# Patient Record
Sex: Female | Born: 1995 | Race: White | Hispanic: No | Marital: Single | Smoking: Never smoker
Health system: Southern US, Community
[De-identification: ages and names within clinical notes are randomized; demographics above are authoritative.]

## PROBLEM LIST (undated history)

## (undated) DIAGNOSIS — E079 Disorder of thyroid, unspecified: Secondary | ICD-10-CM

## (undated) DIAGNOSIS — T7840XA Allergy, unspecified, initial encounter: Secondary | ICD-10-CM

## (undated) DIAGNOSIS — L309 Dermatitis, unspecified: Secondary | ICD-10-CM

## (undated) DIAGNOSIS — R7303 Prediabetes: Secondary | ICD-10-CM

## (undated) DIAGNOSIS — E669 Obesity, unspecified: Secondary | ICD-10-CM

## (undated) DIAGNOSIS — E783 Hyperchylomicronemia: Secondary | ICD-10-CM

## (undated) HISTORY — DX: Dermatitis, unspecified: L30.9

## (undated) HISTORY — DX: Obesity, unspecified: E66.9

## (undated) HISTORY — DX: Allergy, unspecified, initial encounter: T78.40XA

## (undated) HISTORY — DX: Prediabetes: R73.03

## (undated) HISTORY — DX: Hyperchylomicronemia: E78.3

## (undated) HISTORY — DX: Disorder of thyroid, unspecified: E07.9

---
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
## 2017-04-08 ENCOUNTER — Ambulatory Visit: Payer: Commercial Managed Care - PPO | Admitting: Family Medicine

## 2017-04-08 ENCOUNTER — Encounter: Payer: Self-pay | Admitting: Family Medicine

## 2017-04-08 VITALS — BP 118/68 | HR 95 | Temp 98.5°F | Resp 16 | Wt 193.9 lb

## 2017-04-08 DIAGNOSIS — J029 Acute pharyngitis, unspecified: Secondary | ICD-10-CM | POA: Diagnosis not present

## 2017-04-08 LAB — POCT RAPID STREP A (OFFICE): Rapid Strep A Screen: NEGATIVE

## 2017-04-08 MED ORDER — AMOXICILLIN-POT CLAVULANATE 875-125 MG PO TABS: 1.0000 | ORAL_TABLET | Freq: Two times a day (BID) | ORAL | 0 refills | Status: DC

## 2017-04-08 NOTE — Progress Notes (Signed)
Name: Alexandra Leo GrosserM Regis   MRN: [id number]    DOB: 05/06/1996   Date:04/08/2017       Progress Note  Subjective  Chief Complaint  Chief Complaint  Patient presents with  . Sore Throat    since sunday; pt denies any other symptoms. sore,. painful this morning and scratchy     Sore Throat   This is a new problem. The current episode started yesterday. There has been no fever. Associated symptoms include coughing and trouble swallowing. Pertinent negatives include no ear pain or neck pain. She has tried acetaminophen for the symptoms. The treatment provided moderate relief.     Past Medical History:  Diagnosis Date  . Allergy   . Eczema   . 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 Obesity (BMI 35.0-39.9 without comorbidity) 11/22/2014   Has gained 10lbs in a few months by 03/24/14   . Prediabetes 07/09/2016  . Thyroid disease     History reviewed. No pertinent surgical history.  Family History  Problem Relation Age of Onset  . Hypertension Mother        borderline  . Hypothyroidism Mother   . Hypertension Father        borderline  . Hypothyroidism Sister   . Hypertension Maternal Grandmother   . Alzheimer's disease Maternal Grandmother   . Alzheimer's disease Maternal Grandfather     Social History   Socioeconomic History  . Marital status: Single    Spouse name: Not on file  . Number of children: Not on file  . Years of education: Not on file  . Highest education level: Not on file  Social Needs  . Financial resource strain: Not on file  . Food insecurity - worry: Not on file  . Food insecurity - inability: Not on file  . Transportation needs - medical: Not on file  . Transportation needs - non-medical: Not on file  Occupational History  . Not on file  Tobacco Use  . Smoking status: Never Smoker  . Smokeless tobacco: Never Used  Substance and Sexual Activity  . Alcohol use: No    Alcohol/week: 0.0 oz  . Drug use: No  . Sexual activity: No  Other Topics  Concern  . Not on file  Social History Narrative  . Not on file     Current Outpatient Medications:  .  ibuprofen (ADVIL,MOTRIN) 200 MG tablet, Take 200 mg by mouth every 6 (six) hours as needed. Taking 200 mg -400 mg tablet as needed depending on pain, Disp: , Rfl:  .  levothyroxine (SYNTHROID, LEVOTHROID) 100 MCG tablet, Take 1 tablet (100 mcg total) by mouth daily. *Brand name Synthroid medically necessary*, Disp: 30 tablet, Rfl: 11 .  triamcinolone ointment (KENALOG) 0.1 %, Apply 1 application topically 2 (two) times daily. Too strong for face, groin, underarms (Patient not taking: Reported on 10/25/2016), Disp: 30 g, Rfl: 0  No Known Allergies   Review of Systems  HENT: Positive for trouble swallowing. Negative for ear pain.   Respiratory: Positive for cough.   Musculoskeletal: Negative for neck pain.     Objective  Vitals:   04/08/17 1049  BP: 118/68  Pulse: 95  Resp: 16  Temp: 98.5 F (36.9 C)  TempSrc: Oral  SpO2: 98%  Weight: 193 lb 14.4 oz (88 kg)    Physical Exam  Constitutional: She is well-developed, well-nourished, and in no distress.  HENT:  Head: Normocephalic and atraumatic.  Right Ear: Tympanic membrane and ear canal normal. No drainage or swelling.  Left Ear: Tympanic membrane and ear canal normal. No drainage or swelling.  Nose: Right sinus exhibits no maxillary sinus tenderness and no frontal sinus tenderness. Left sinus exhibits no maxillary sinus tenderness and no frontal sinus tenderness.  Mouth/Throat: Posterior oropharyngeal erythema present. No oropharyngeal exudate or posterior oropharyngeal edema.  Cardiovascular: Normal rate, regular rhythm and normal heart sounds.  No murmur heard. Pulmonary/Chest: Effort normal and breath sounds normal. She has no wheezes.  Abdominal: Soft. Bowel sounds are normal. There is no tenderness.  Lymphadenopathy:       Right cervical: No superficial cervical adenopathy present.      Left cervical: No  superficial cervical adenopathy present.  Psychiatric: Mood, memory, affect and judgment normal.  Nursing note and vitals reviewed.     Recent Results (from the past 2160 hour(s))  POCT rapid strep A     Status: Normal   Collection Time: 04/08/17 10:59 AM  Result Value Ref Range   Rapid Strep A Screen Negative Negative     Assessment & Plan  1. Sore throat Rapid strep is negative, suspect resolving viral URI.  In case symptoms continue unabated after 3-4 days of conservative treatment, she may consider starting on antibiotic which was provided at today's visit. - POCT rapid strep A - amoxicillin-clavulanate (AUGMENTIN) 875-125 MG tablet; Take 1 tablet by mouth 2 (two) times daily.  Dispense: 14 tablet; Refill: 0  Japheth Diekman Asad A. Faylene KurtzShah [facility] 04/08/2017 11:06 AM

## 2017-04-26 ENCOUNTER — Ambulatory Visit: Payer: Commercial Managed Care - PPO | Admitting: Family Medicine

## 2017-10-01 IMAGING — CR DG ABDOMEN 1V
2 series · 2 of 2 positions shown · non-contrast
Comparison: None.

CLINICAL DATA: Lower abdominal pain since this morning. Patient has
mononucleosis.

EXAM:
ABDOMEN - 1 VIEW

[abdomen kub (1 of 2)]
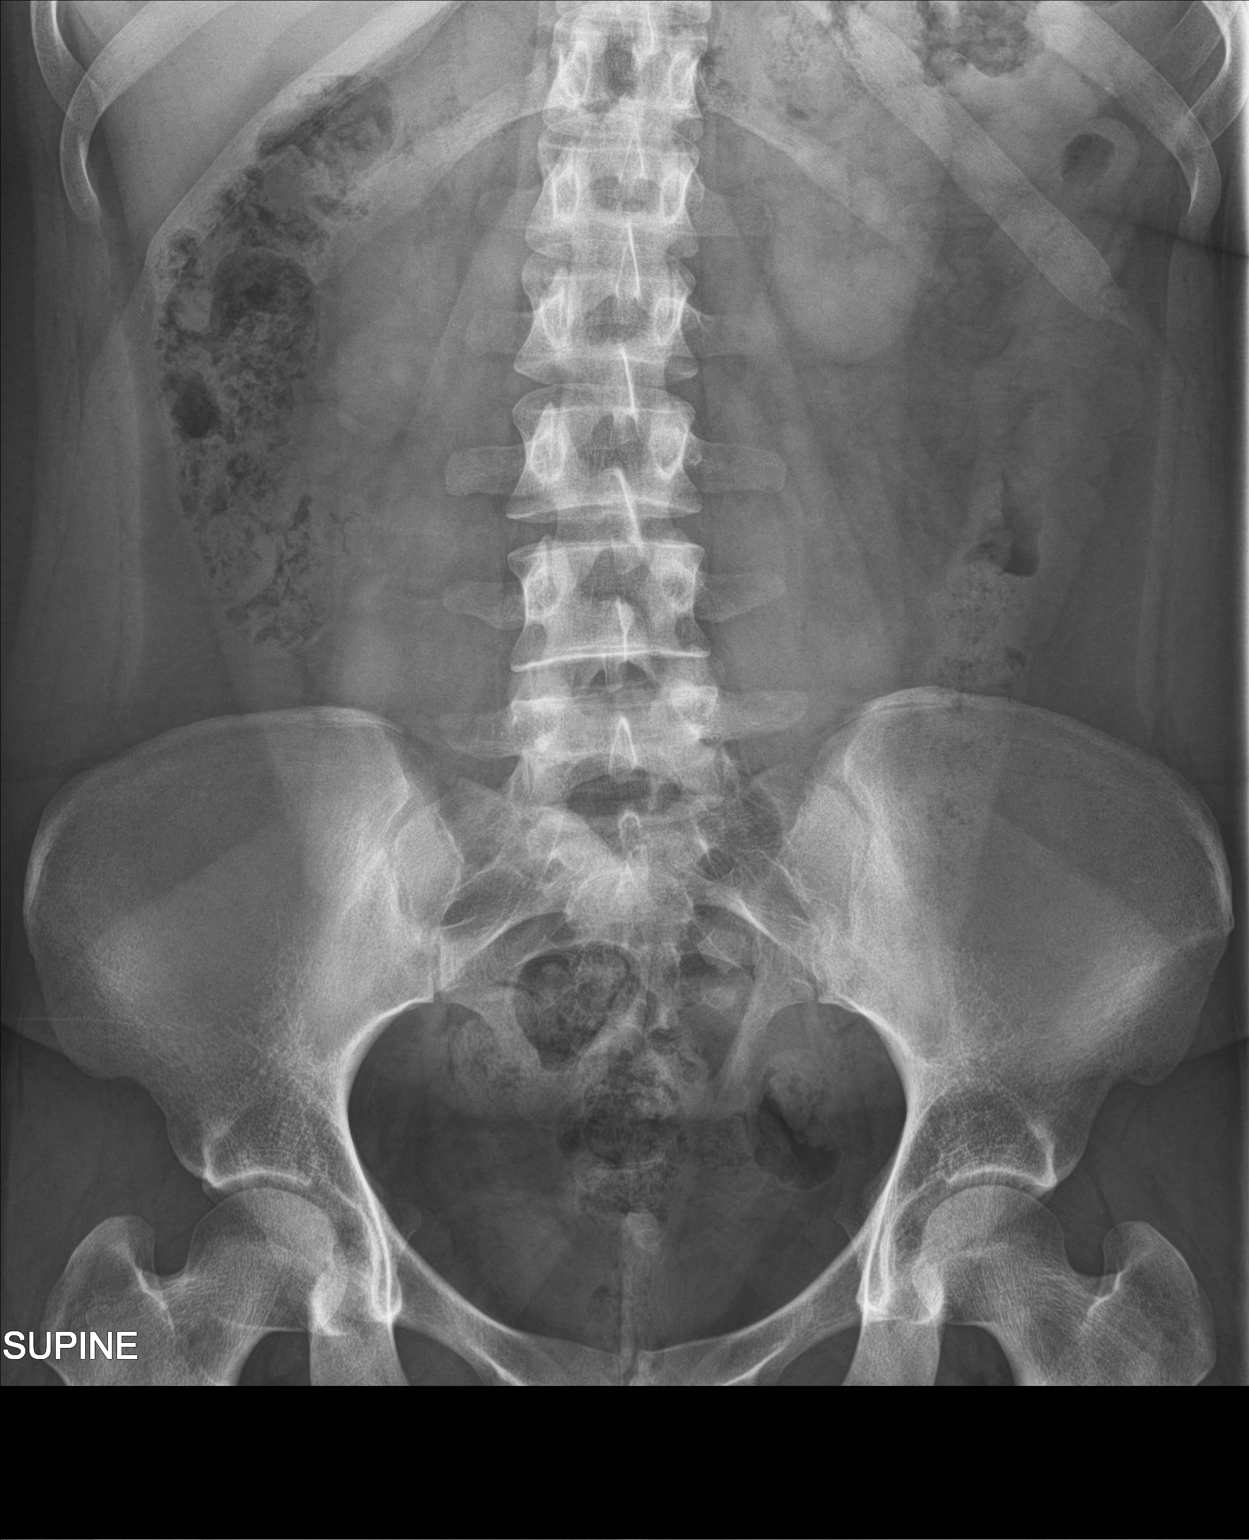

[abdomen kub (2 of 2)]
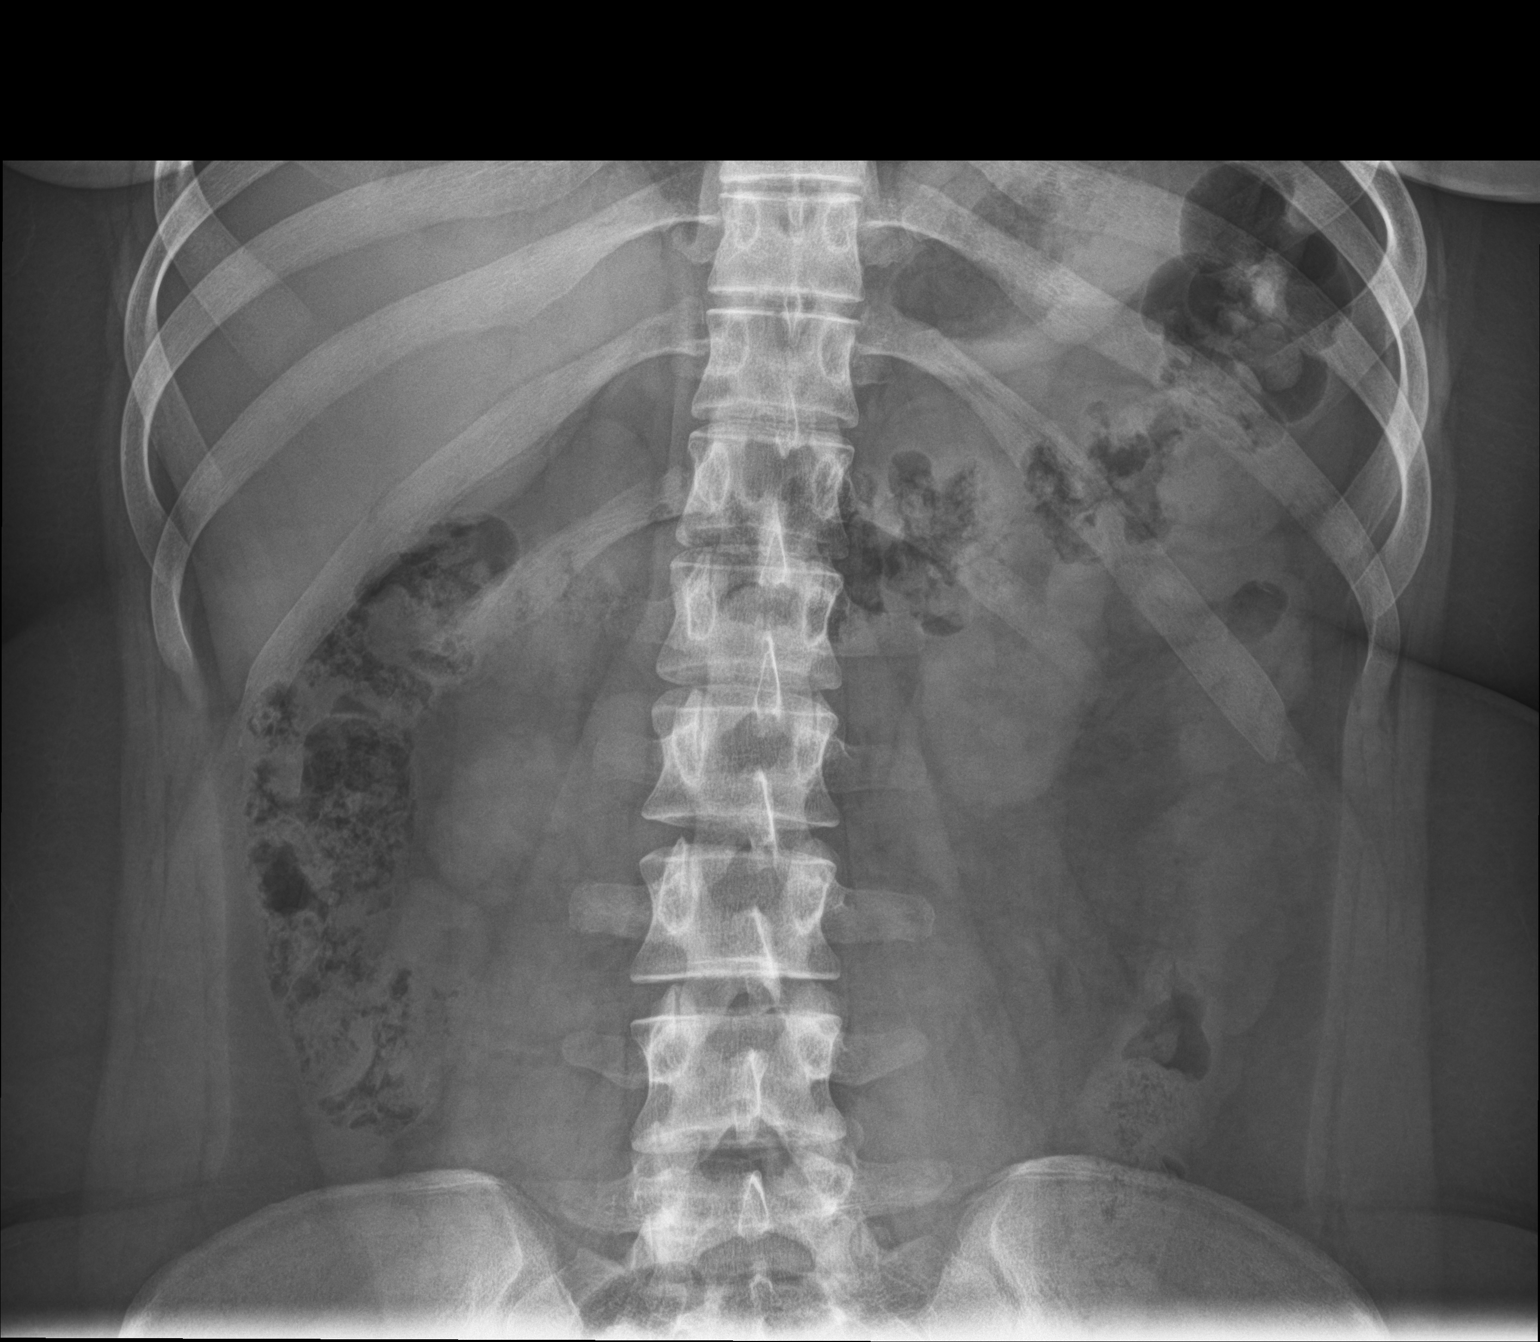

[2 of 2 positions shown; findings below may reference images not displayed]

FINDINGS: The visualized lung bases are clear. The abdominal bowel gas pattern
is unremarkable. The soft tissue shadows are maintained. No
worrisome calcifications. The bony structures are normal.
IMPRESSION: No plain film findings for an acute abdominal process.

## 2017-11-18 ENCOUNTER — Telehealth: Payer: Self-pay | Admitting: Family Medicine

## 2017-11-18 NOTE — Telephone Encounter (Signed)
Lab Results  Component Value Date   TSH 3.05 11/01/2016   Last appt and labs were over a year ago I'll refill her med, and please ask her to schedule an appointment in the next month Thank you

## 2017-11-18 NOTE — Telephone Encounter (Signed)
Called [phone] and left voice message then I called [phone] and spoke with mom told her to have the patient to return call to schedule appt.

## 2017-12-19 ENCOUNTER — Other Ambulatory Visit: Payer: Self-pay | Admitting: Family Medicine

## 2017-12-20 NOTE — Telephone Encounter (Signed)
Lab Results  Component Value Date   TSH 3.05 11/01/2016   Patient needs an appointment Last thyroid check was over a year ago I have not seen her since June 2018 Please see the OPEN phone note from 11/18/17; please complete and sign off on that note

## 2017-12-21 ENCOUNTER — Other Ambulatory Visit: Payer: Self-pay | Admitting: Family Medicine

## 2017-12-23 ENCOUNTER — Telehealth: Payer: Self-pay | Admitting: Nurse Practitioner

## 2017-12-23 NOTE — Telephone Encounter (Signed)
This is open and I am closing it out; see other phone note

## 2017-12-23 NOTE — Telephone Encounter (Signed)
Left voice message on [phone] and [phone] informing pt to give office a call to schedule appt for additional med refill.

## 2017-12-23 NOTE — Telephone Encounter (Signed)
Tried calling [phone] and  [phone] left voice mail informing pt to give the office a call to schedule an appt

## 2017-12-23 NOTE — Telephone Encounter (Signed)
Please call and schedule an appointment within the next week or so; received request for synthroid patient has not been seen in clinic for thyroid in over one year. Will fill 14 days of medicine to allow for time for appointment.

## 2017-12-23 NOTE — Telephone Encounter (Signed)
Please see my note and ask patient to schedule an appointment

## 2017-12-31 ENCOUNTER — Other Ambulatory Visit: Payer: Self-pay

## 2017-12-31 NOTE — Telephone Encounter (Signed)
Refill request for thyroid medication:  Synthroid 100 mcg  Last Physical: None indicated  Lab Results  Component Value Date   TSH 3.05 11/01/2016   No follow up.

## 2018-01-02 NOTE — Telephone Encounter (Signed)
Patient needs appointment in order to obtain further refills.  

## 2018-01-02 NOTE — Telephone Encounter (Signed)
Spoke with mom and she stated that she will call back once she check her schedule.

## 2018-01-10 ENCOUNTER — Other Ambulatory Visit: Payer: Self-pay

## 2018-01-10 DIAGNOSIS — E038 Other specified hypothyroidism: Secondary | ICD-10-CM

## 2018-01-10 MED ORDER — SYNTHROID 100 MCG PO TABS: 100.0000 ug | ORAL_TABLET | Freq: Every day | ORAL | 0 refills | Status: DC

## 2018-01-10 NOTE — Telephone Encounter (Signed)
Refill request for thyroid medication: Synthroid 100 mcg  Last Physical: None indicated  Lab Results  Component Value Date   TSH 3.05 11/01/2016    Follow-ups on file. 01/21/2018

## 2018-01-17 ENCOUNTER — Ambulatory Visit: Payer: Commercial Managed Care - PPO | Admitting: Obstetrics & Gynecology

## 2018-01-17 ENCOUNTER — Ambulatory Visit (INDEPENDENT_AMBULATORY_CARE_PROVIDER_SITE_OTHER): Payer: Commercial Managed Care - PPO

## 2018-01-17 ENCOUNTER — Telehealth: Payer: Self-pay

## 2018-01-17 ENCOUNTER — Other Ambulatory Visit: Payer: Self-pay | Admitting: Obstetrics & Gynecology

## 2018-01-17 ENCOUNTER — Encounter: Payer: Self-pay | Admitting: Obstetrics & Gynecology

## 2018-01-17 VITALS — BP 120/80 | Ht 61.0 in | Wt 185.0 lb

## 2018-01-17 DIAGNOSIS — M545 Low back pain, unspecified: Secondary | ICD-10-CM

## 2018-01-17 DIAGNOSIS — R1031 Right lower quadrant pain: Secondary | ICD-10-CM | POA: Diagnosis not present

## 2018-01-17 DIAGNOSIS — N83202 Unspecified ovarian cyst, left side: Secondary | ICD-10-CM

## 2018-01-17 DIAGNOSIS — R1032 Left lower quadrant pain: Secondary | ICD-10-CM

## 2018-01-17 MED ORDER — MELOXICAM 7.5 MG PO TABS: 7.5000 mg | ORAL_TABLET | Freq: Two times a day (BID) | ORAL | 0 refills | Status: DC | PRN

## 2018-01-17 NOTE — Telephone Encounter (Signed)
Pt's dad calling for pain med to be called in for pt's pain.  She was seen  This am by [facility].  [phone]

## 2018-01-17 NOTE — Telephone Encounter (Signed)
WIll eRx Meloxicam, but need to be careful as pain meds can often upset GI, and her pain may be GI related more so than cyst related.

## 2018-01-17 NOTE — Progress Notes (Addendum)
Gynecology Pelvic Pain Evaluation   Chief Complaint:  Chief Complaint  Patient presents with  . Back Pain  . Abdominal Pain    History of Present Illness:   Patient is a 22 y.o. No obstetric history on file. who LMP was Patient's last menstrual period was 12/27/2017., presents today for a problem visit.  She complains of pain.   Her pain is localized to the suprapubic and low back (separate but same time span) area, described as constant, burning and stabbing, began a week ago and its severity is described as severe. The pain radiates to the  Non-radiating. She has these associated symptoms which include abdominal pain, diarrhea, fatigue, nausea and vomiting. Patient has these modifiers which include relaxation that make it better and unable to associate with any factor that make it worse.  Context includes: spontaneous.  Past history includes reg cycles w Dysmenorrhes (not on period now);  Usually reg and 7 days flow.  Denies sexual activity.  No vag discharge or bleeding.  Previous evaluation: emergency room visit on 01/14/18.  Prior Diagnosis: none.  Labs normal in ER.  No imaging studies done. Previous Treatment: none.  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 Also,  has no past surgical history on file., family history includes Alzheimer's disease in her maternal grandfather and maternal grandmother; Hypertension in her father, maternal grandmother, and mother; Hypothyroidism in her mother and sister.,  reports that she has never smoked. She has never used smokeless tobacco. She reports that she does not drink alcohol or use drugs.  She has a current medication list which includes the following prescription(s): ibuprofen, synthroid, and triamcinolone ointment. Also, has No Known Allergies.  Review of Systems  Constitutional:  Positive for malaise/fatigue. Negative for chills and fever.  HENT: Negative for congestion, sinus pain and sore throat.   Eyes: Negative for blurred vision and pain.  Respiratory: Positive for shortness of breath. Negative for cough and wheezing.   Cardiovascular: Negative for chest pain and leg swelling.  Gastrointestinal: Positive for abdominal pain, diarrhea, nausea and vomiting. Negative for constipation and heartburn.  Genitourinary: Positive for frequency. Negative for dysuria, hematuria and urgency.  Musculoskeletal: Negative for back pain, joint pain, myalgias and neck pain.  Skin: Negative for itching and rash.  Neurological: Positive for dizziness and headaches. Negative for tremors and weakness.  Endo/Heme/Allergies: Does not bruise/bleed easily.  Psychiatric/Behavioral: Negative for depression. The patient is not nervous/anxious and does not have insomnia.    Objective: BP 120/80   Ht 5\' 1"  (1.549 m)   Wt 185 lb (83.9 kg)   LMP 12/27/2017   BMI 34.96 kg/m  Physical Exam  Constitutional: She is oriented to person, place, and time. She appears well-developed and well-nourished. No distress.  Abdominal: Soft. Normal appearance and bowel sounds are normal. There is no hepatosplenomegaly. There is generalized tenderness. There is no rebound, no guarding, no tenderness at McBurney's point and negative Murphy's sign.  Musculoskeletal: Normal range of motion.  Neurological: She is alert and oriented to person, place, and time.  Skin: Skin is warm and dry.  Psychiatric: She has a normal mood and affect.  Vitals reviewed.  Assessment: 22 y.o. Go with : 1. Acute bilateral lower abdominal pain 2. Low back pain Likely Gastroenteritis Consideration for ovarian cysts discussed - US PELVIS (TRANSABDOMINAL First due to lack of sexual history) - Rest Tylenol, Heat to back,  fluids  Annamarie Major, MD, [facility], [facility] 01/17/2018  10:45  AM  ADDENDUM: Review of ULTRASOUND.    I have personally reviewed images and report of recent ultrasound done at [facility].    Plan of management to be discussed with patient.    Small cyst, otherwise normal Monitor for persistance or recurrence  Annamarie Major, MD, [facility], [facility] 01/17/2018  2:07 PM

## 2018-01-17 NOTE — Telephone Encounter (Signed)
Left message to make pt aware.  

## 2018-01-17 NOTE — Patient Instructions (Signed)

## 2018-01-21 ENCOUNTER — Ambulatory Visit: Payer: Commercial Managed Care - PPO | Admitting: Family Medicine

## 2018-02-24 ENCOUNTER — Telehealth: Payer: Self-pay | Admitting: Family Medicine

## 2018-02-24 DIAGNOSIS — E038 Other specified hypothyroidism: Secondary | ICD-10-CM

## 2018-02-24 MED ORDER — SYNTHROID 100 MCG PO TABS: 100.0000 ug | ORAL_TABLET | Freq: Every day | ORAL | 0 refills | Status: DC

## 2018-02-24 NOTE — Telephone Encounter (Signed)
Medication: SYNTHROID 100 MCG tablet - mother thinks she has a few days left  - appt scheduled for 03/11/18 9:00am with Dr. Sherie Don  Last OV Dr. Sherie Don 10/25/16 Last refill Synthroid 01/10/18 12 tablets no refills  Has the patient contacted their pharmacy? Yes - no refills and advised to schedule appt Preferred Pharmacy (with phone number or street name): [id number] - [place], [place] - [address]            [phone] (Phone) [phone] (Fax)

## 2018-02-24 NOTE — Telephone Encounter (Signed)
Copied from CRM [id number]. Topic: Quick Communication - Rx Refill/Question >> Feb 24, 2018 10:45 AM Alexandra Crane wrote: Medication: SYNTHROID 100 MCG tablet - mother thinks she has a few days left  - appt scheduled for 03/11/18 9:00am with Dr. Sherie Don  Last OV Dr. Sherie Don 10/25/16 Last refill Synthroid 01/10/18 12 tablets no refills  Has the patient contacted their pharmacy? Yes - no refills and advised to schedule appt Preferred Pharmacy (with phone number or street name): [id number] - [place], [place] - [address] [phone] (Phone) [phone] (Fax)

## 2018-03-11 ENCOUNTER — Ambulatory Visit: Payer: Commercial Managed Care - PPO | Admitting: Family Medicine

## 2018-03-13 ENCOUNTER — Ambulatory Visit: Payer: Commercial Managed Care - PPO | Admitting: Family Medicine

## 2018-03-14 ENCOUNTER — Encounter: Payer: Self-pay | Admitting: Family Medicine

## 2018-03-14 ENCOUNTER — Ambulatory Visit: Payer: Commercial Managed Care - PPO | Admitting: Family Medicine

## 2018-03-14 VITALS — BP 126/78 | HR 105 | Temp 98.3°F | Resp 16 | Ht 61.0 in | Wt 193.9 lb

## 2018-03-14 DIAGNOSIS — L2082 Flexural eczema: Secondary | ICD-10-CM

## 2018-03-14 DIAGNOSIS — Z23 Encounter for immunization: Secondary | ICD-10-CM | POA: Diagnosis not present

## 2018-03-14 DIAGNOSIS — E669 Obesity, unspecified: Secondary | ICD-10-CM

## 2018-03-14 DIAGNOSIS — E783 Hyperchylomicronemia: Secondary | ICD-10-CM

## 2018-03-14 DIAGNOSIS — R7303 Prediabetes: Secondary | ICD-10-CM

## 2018-03-14 DIAGNOSIS — E038 Other specified hypothyroidism: Secondary | ICD-10-CM

## 2018-03-14 DIAGNOSIS — R718 Other abnormality of red blood cells: Secondary | ICD-10-CM

## 2018-03-14 MED ORDER — TRIAMCINOLONE ACETONIDE 0.1 % EX CREA: 1.0000 "application " | TOPICAL_CREAM | Freq: Two times a day (BID) | CUTANEOUS | 0 refills | Status: DC

## 2018-03-14 NOTE — Progress Notes (Signed)
Name: Alexandra Crane   MRN: [id number]    DOB: [birth year]   Date:03/14/2018       Progress Note  Subjective  Chief Complaint  Chief Complaint  Patient presents with  . Hypothyroidism    HPI  Pt presents to follow up:  Hypothyroidism: She was diagnosed at age 22yo; misses about 3-4 pills a month. She denies having any hair/skin/nail changes, constipation/diarrhea, palpitations, has gained about 8lbs.  Obesity & Prediabetes: Does not exercise - would like to start walking.  Mom cooks at home for the most part; eats some salads at home, tends to eat burgers/fries/chicken/fast food as well. Significant education is provided. Mom notes that pt's dad brings a lot of junk food in to the house. Referral to nutritionist. Extensive discussion on weight management & prediabetes is had.  Eczema: No current outbreak; uses triamcinolone; usually only in her AC and popliteal fossas.   Hx elevated HCT - we will recheck today.  Eelvated TGD's - in 2018; we will recheck today.  Patient Active Problem List   Diagnosis Date Noted  . Allergic rhinitis 10/25/2016  . Snoring 08/08/2016  . Elevated hematocrit 08/08/2016  . 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 Prediabetes 07/09/2016  . Family planning 11/22/2014  . Flexural eczema 11/22/2014  . 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 Obesity (BMI 35.0-39.9 without comorbidity) 11/22/2014    History reviewed. No pertinent surgical history.  Family History  Problem Relation Age of Onset  . Hypertension Mother        borderline  . Hypothyroidism Mother   . Hypertension Father        borderline  . Hypothyroidism Sister   . Hypertension Maternal Grandmother   . Alzheimer's disease Maternal Grandmother   . Alzheimer's disease Maternal Grandfather     Social History   Socioeconomic History  . Marital status: Single    Spouse name: Not on file  . Number of children: Not on file  . Years of education: Not on file  . Highest education  level: Not on file  Occupational History  . Not on file  Social Needs  . Financial resource strain: Not on file  . Food insecurity:    Worry: Not on file    Inability: Not on file  . Transportation needs:    Medical: Not on file    Non-medical: Not on file  Tobacco Use  . Smoking status: Never Smoker  . Smokeless tobacco: Never Used  Substance and Sexual Activity  . Alcohol use: No    Alcohol/week: 0.0 standard drinks  . Drug use: No  . Sexual activity: Never  Lifestyle  . Physical activity:    Days per week: Not on file    Minutes per session: Not on file  . Stress: Not on file  Relationships  . Social connections:    Talks on phone: Not on file    Gets together: Not on file    Attends religious service: Not on file    Active member of club or organization: Not on file    Attends meetings of clubs or organizations: Not on file    Relationship status: Not on file  . Intimate partner violence:    Fear of current or ex partner: Not on file    Emotionally abused: Not on file    Physically abused: Not on file    Forced sexual activity: Not on file  Other Topics Concern  . Not on file  Social History Narrative  . Not  on file     Current Outpatient Medications:  .  ibuprofen (ADVIL,MOTRIN) 200 MG tablet, Take 200 mg by mouth every 6 (six) hours as needed. Taking 200 mg -400 mg tablet as needed depending on pain, Disp: , Rfl:  .  SYNTHROID 100 MCG tablet, Take 1 tablet (100 mcg total) by mouth daily. Routine refill at appointment, Disp: 30 tablet, Rfl: 0 .  triamcinolone cream (KENALOG) 0.1 %, Apply 1 application topically 2 (two) times daily., Disp: 30 g, Rfl: 0  No Known Allergies  I personally reviewed active problem list, medication list, allergies, lab results with the patient/caregiver today.   ROS Constitutional: Negative for fever or weight change.  Respiratory: Negative for cough and shortness of breath.   Cardiovascular: Negative for chest pain or  palpitations.  Gastrointestinal: Negative for abdominal pain, no bowel changes.  Musculoskeletal: Negative for gait problem or joint swelling.  Skin: Negative for rash.  Neurological: Negative for dizziness or headache.  No other specific complaints in a complete review of systems (except as listed in HPI above).  Objective  Vitals:   03/14/18 0850  BP: 126/78  Pulse: (!) 105  Resp: 16  Temp: 98.3 F (36.8 C)  TempSrc: Oral  SpO2: 98%  Weight: 193 lb 14.4 oz (88 kg)  Height: 5\' 1"  (1.549 m)   Body mass index is 36.64 kg/m.   Physical Exam  Constitutional: Patient appears well-developed and well-nourished, obese. No distress.  HENT: Head: Normocephalic and atraumatic. Ears: bilateral TMs with no erythema or effusion; Nose: Nose normal. Mouth/Throat: Oropharynx is clear and moist. No oropharyngeal exudate or tonsillar swelling.  Eyes: Conjunctivae and EOM are normal. No scleral icterus.  Pupils are equal, round, and reactive to light.  Neck: Normal range of motion. Neck supple. No JVD present. No thyromegaly present.  Cardiovascular: Normal rate, regular rhythm and normal heart sounds.  No murmur heard. No BLE edema. Pulmonary/Chest: Effort normal and breath sounds normal. No respiratory distress. Musculoskeletal: Normal range of motion, no joint effusions. No gross deformities Neurological: Pt is alert and oriented to person, place, and time. No cranial nerve deficit. Coordination, balance, strength, speech and gait are normal.  Skin: Skin is warm and dry. No rash noted. No erythema.  Psychiatric: Patient has a normal mood and affect. behavior is normal. Judgment and thought content normal.  No results found for this or any previous visit (from the past 72 hour(s)).  PHQ2/9: Depression screen [facility] 2/9 03/14/2018 03/14/2018 04/08/2017 10/25/2016 08/07/2016  Decreased Interest 0 0 0 0 0  Down, Depressed, Hopeless 0 0 0 0 0  PHQ - 2 Score 0 0 0 0 0  Altered sleeping 0 - - - -    Tired, decreased energy 0 - - - -  Change in appetite 0 - - - -  Feeling bad or failure about yourself  0 - - - -  Trouble concentrating 0 - - - -  Moving slowly or fidgety/restless 0 - - - -  Suicidal thoughts 0 - - - -  PHQ-9 Score 0 - - - -  Difficult doing work/chores Not difficult at all - - - -   Fall Risk: Fall Risk  03/14/2018 04/08/2017 10/25/2016 08/07/2016 07/09/2016  Falls in the past year? 0 No No No No  Number falls in past yr: 0 - - - -   Functional Status Survey: Is the patient deaf or have difficulty hearing?: No Does the patient have difficulty seeing, even when wearing glasses/contacts?: No  Does the patient have difficulty concentrating, remembering, or making decisions?: No Does the patient have difficulty walking or climbing stairs?: No Does the patient have difficulty dressing or bathing?: No Does the patient have difficulty doing errands alone such as visiting a doctor's office or shopping?: No  Assessment & Plan  1. Hypothyroidism, juvenile - TSH - Will provide refill once labs are returned  2. Need for influenza vaccination - Flu Vaccine QUAD 6+ mos PF IM (Fluarix Quad PF)  3. Obesity (BMI 35.0-39.9 without comorbidity) Discussed importance of 150 minutes of physical activity weekly, eat two servings of fish weekly, eat one serving of tree nuts ( cashews, pistachios, pecans, almonds.Marland Kitchen) every other day, eat 6 servings of fruit/vegetables daily and drink plenty of water and avoid sweet beverages. - Hemoglobin A1c - COMPLETE METABOLIC PANEL WITH GFR - Amb ref to Medical Nutrition Therapy-MNT  4. Prediabetes - Hemoglobin A1c - COMPLETE METABOLIC PANEL WITH GFR - Amb ref to Medical Nutrition Therapy-MNT  5. Flexural eczema - triamcinolone cream (KENALOG) 0.1 %; Apply 1 application topically 2 (two) times daily.  Dispense: 30 g; Refill: 0  6. Elevated hematocrit - CBC w/Diff/Platelet  7. Exogenous hypertriglyceridemia - Lipid panel  Face-to-face  time with patient was more than 25 minutes, >50% time spent counseling and coordination of care

## 2018-03-14 NOTE — Patient Instructions (Signed)
Diabetes Mellitus and Nutrition When you have diabetes (diabetes mellitus), it is very important to have healthy eating habits because your blood sugar (glucose) levels are greatly affected by what you eat and drink. Eating healthy foods in the appropriate amounts, at about the same times every day, can help you:  Control your blood glucose.  Lower your risk of heart disease.  Improve your blood pressure.  Reach or maintain a healthy weight.  Every person with diabetes is different, and each person has different needs for a meal plan. Your health care provider may recommend that you work with a diet and nutrition specialist (dietitian) to make a meal plan that is best for you. Your meal plan may vary depending on factors such as:  The calories you need.  The medicines you take.  Your weight.  Your blood glucose, blood pressure, and cholesterol levels.  Your activity level.  Other health conditions you have, such as heart or kidney disease.  How do carbohydrates affect me? Carbohydrates affect your blood glucose level more than any other type of food. Eating carbohydrates naturally increases the amount of glucose in your blood. Carbohydrate counting is a method for keeping track of how many carbohydrates you eat. Counting carbohydrates is important to keep your blood glucose at a healthy level, especially if you use insulin or take certain oral diabetes medicines. It is important to know how many carbohydrates you can safely have in each meal. This is different for every person. Your dietitian can help you calculate how many carbohydrates you should have at each meal and for snack. Foods that contain carbohydrates include:  Bread, cereal, rice, pasta, and crackers.  Potatoes and corn.  Peas, beans, and lentils.  Milk and yogurt.  Fruit and juice.  Desserts, such as cakes, cookies, ice cream, and candy.  How does alcohol affect me? Alcohol can cause a sudden decrease in blood  glucose (hypoglycemia), especially if you use insulin or take certain oral diabetes medicines. Hypoglycemia can be a life-threatening condition. Symptoms of hypoglycemia (sleepiness, dizziness, and confusion) are similar to symptoms of having too much alcohol. If your health care provider says that alcohol is safe for you, follow these guidelines:  Limit alcohol intake to no more than 1 drink per day for nonpregnant women and 2 drinks per day for men. One drink equals 12 oz of beer, 5 oz of wine, or 1 oz of hard liquor.  Do not drink on an empty stomach.  Keep yourself hydrated with water, diet soda, or unsweetened iced tea.  Keep in mind that regular soda, juice, and other mixers may contain a lot of sugar and must be counted as carbohydrates.  What are tips for following this plan? Reading food labels  Start by checking the serving size on the label. The amount of calories, carbohydrates, fats, and other nutrients listed on the label are based on one serving of the food. Many foods contain more than one serving per package.  Check the total grams (g) of carbohydrates in one serving. You can calculate the number of servings of carbohydrates in one serving by dividing the total carbohydrates by 15. For example, if a food has 30 g of total carbohydrates, it would be equal to 2 servings of carbohydrates.  Check the number of grams (g) of saturated and trans fats in one serving. Choose foods that have low or no amount of these fats.  Check the number of milligrams (mg) of sodium in one serving. Most people   should limit total sodium intake to less than 2,300 mg per day.  Always check the nutrition information of foods labeled as "low-fat" or "nonfat". These foods may be higher in added sugar or refined carbohydrates and should be avoided.  Talk to your dietitian to identify your daily goals for nutrients listed on the label. Shopping  Avoid buying canned, premade, or processed foods. These  foods tend to be high in fat, sodium, and added sugar.  Shop around the outside edge of the grocery store. This includes fresh fruits and vegetables, bulk grains, fresh meats, and fresh dairy. Cooking  Use low-heat cooking methods, such as baking, instead of high-heat cooking methods like deep frying.  Cook using healthy oils, such as olive, canola, or sunflower oil.  Avoid cooking with butter, cream, or high-fat meats. Meal planning  Eat meals and snacks regularly, preferably at the same times every day. Avoid going long periods of time without eating.  Eat foods high in fiber, such as fresh fruits, vegetables, beans, and whole grains. Talk to your dietitian about how many servings of carbohydrates you can eat at each meal.  Eat 4-6 ounces of lean protein each day, such as lean meat, chicken, fish, eggs, or tofu. 1 ounce is equal to 1 ounce of meat, chicken, or fish, 1 egg, or 1/4 cup of tofu.  Eat some foods each day that contain healthy fats, such as avocado, nuts, seeds, and fish. Lifestyle   Check your blood glucose regularly.  Exercise at least 30 minutes 5 or more days each week, or as told by your health care provider.  Take medicines as told by your health care provider.  Do not use any products that contain nicotine or tobacco, such as cigarettes and e-cigarettes. If you need help quitting, ask your health care provider.  Work with a counselor or diabetes educator to identify strategies to manage stress and any emotional and social challenges. What are some questions to ask my health care provider?  Do I need to meet with a diabetes educator?  Do I need to meet with a dietitian?  What number can I call if I have questions?  When are the best times to check my blood glucose? Where to find more information:  American Diabetes Association: diabetes.org/food-and-fitness/food  Academy of Nutrition and Dietetics:  www.eatright.org/resources/health/diseases-and-conditions/diabetes  National Institute of Diabetes and Digestive and Kidney Diseases (NIH): www.niddk.nih.gov/health-information/diabetes/overview/diet-eating-physical-activity Summary  A healthy meal plan will help you control your blood glucose and maintain a healthy lifestyle.  Working with a diet and nutrition specialist (dietitian) can help you make a meal plan that is best for you.  Keep in mind that carbohydrates and alcohol have immediate effects on your blood glucose levels. It is important to count carbohydrates and to use alcohol carefully. This information is not intended to replace advice given to you by your health care provider. Make sure you discuss any questions you have with your health care provider. Document Released: 01/25/2005 Document Revised: 06/04/2016 Document Reviewed: 06/04/2016 Elsevier Interactive Patient Education  2018 Elsevier Inc.  

## 2018-03-15 LAB — COMPLETE METABOLIC PANEL WITH GFR
AG Ratio: 1.7 (calc) (ref 1.0–2.5)
ALBUMIN MSPROF: 4.3 g/dL (ref 3.6–5.1)
ALKALINE PHOSPHATASE (APISO): 83 U/L (ref 33–115)
ALT: 19 U/L (ref 6–29)
AST: 18 U/L (ref 10–30)
BILIRUBIN TOTAL: 0.4 mg/dL (ref 0.2–1.2)
BUN: 10 mg/dL (ref 7–25)
CHLORIDE: 103 mmol/L (ref 98–110)
CO2: 27 mmol/L (ref 20–32)
Calcium: 9.3 mg/dL (ref 8.6–10.2)
Creat: 0.93 mg/dL (ref 0.50–1.10)
GFR, Est African American: 103 mL/min/{1.73_m2} (ref >=60)
GFR, Est Non African American: 88 mL/min/{1.73_m2} (ref >=60)
GLOBULIN: 2.5 g/dL (ref 1.9–3.7)
GLUCOSE: 83 mg/dL (ref 65–99)
Potassium: 4.2 mmol/L (ref 3.5–5.3)
SODIUM: 139 mmol/L (ref 135–146)
Total Protein: 6.8 g/dL (ref 6.1–8.1)

## 2018-03-15 LAB — CBC WITH DIFFERENTIAL/PLATELET
Basophils Absolute: 46 cells/uL (ref 0–200)
Basophils Relative: 0.6 %
EOS PCT: 1.3 %
Eosinophils Absolute: 100 cells/uL (ref 15–500)
HCT: 41.8 % (ref 35.0–45.0)
Hemoglobin: 13.6 g/dL (ref 11.7–15.5)
Lymphs Abs: 1771 cells/uL (ref 850–3900)
MCH: 27 pg (ref 27.0–33.0)
MCHC: 32.5 g/dL (ref 32.0–36.0)
MCV: 82.9 fL (ref 80.0–100.0)
MONOS PCT: 8.1 %
MPV: 9.2 fL (ref 7.5–12.5)
NEUTROS PCT: 67 %
Neutro Abs: 5159 cells/uL (ref 1500–7800)
PLATELETS: 377 10*3/uL (ref 140–400)
RBC: 5.04 10*6/uL (ref 3.80–5.10)
RDW: 11.9 % (ref 11.0–15.0)
Total Lymphocyte: 23 %
WBC mixed population: 624 cells/uL (ref 200–950)
WBC: 7.7 10*3/uL (ref 3.8–10.8)

## 2018-03-15 LAB — LIPID PANEL
Cholesterol: 160 mg/dL (ref <200)
HDL: 46 mg/dL — AB (ref >50)
LDL CHOLESTEROL (CALC): 91 mg/dL
NON-HDL CHOLESTEROL (CALC): 114 mg/dL (ref <130)
TRIGLYCERIDES: 124 mg/dL (ref <150)
Total CHOL/HDL Ratio: 3.5 (calc) (ref <5.0)

## 2018-03-15 LAB — HEMOGLOBIN A1C
EAG (MMOL/L): 6 (calc)
Hgb A1c MFr Bld: 5.4 % of total Hgb (ref <5.7)
Mean Plasma Glucose: 108 (calc)

## 2018-03-15 LAB — TSH: TSH: 8.77 mIU/L — ABNORMAL HIGH

## 2018-03-18 ENCOUNTER — Other Ambulatory Visit: Payer: Self-pay | Admitting: Family Medicine

## 2018-03-18 DIAGNOSIS — E038 Other specified hypothyroidism: Secondary | ICD-10-CM

## 2018-03-18 MED ORDER — SYNTHROID 100 MCG PO TABS: ORAL_TABLET | ORAL | 0 refills | Status: DC

## 2018-03-18 NOTE — Telephone Encounter (Signed)
Copied from CRM [id number]. Topic: Quick Communication - Rx Refill/Question >> Mar 18, 2018 11:22 AM Crist Infante wrote: Medication: SYNTHROID 100 MCG tablet Brand name  Mom calling to ask if the new Rx can be called in for the new dosage, (1 1/2 tabs), and she request a 90 day. [id number] - [place], [place] - [address] [phone] (Phone)

## 2018-04-11 ENCOUNTER — Encounter: Payer: Self-pay | Admitting: Family Medicine

## 2018-04-11 ENCOUNTER — Ambulatory Visit: Payer: Commercial Managed Care - PPO | Admitting: Family Medicine

## 2018-04-11 VITALS — BP 110/72 | HR 99 | Temp 98.6°F | Ht 61.0 in | Wt 201.8 lb

## 2018-04-11 DIAGNOSIS — J069 Acute upper respiratory infection, unspecified: Secondary | ICD-10-CM

## 2018-04-11 MED ORDER — BENZONATATE 100 MG PO CAPS
100.0000 mg | ORAL_CAPSULE | Freq: Two times a day (BID) | ORAL | 0 refills | Status: DC | PRN
Start: 2018-04-11 — End: 2018-12-09

## 2018-04-11 MED ORDER — FLUTICASONE PROPIONATE 50 MCG/ACT NA SUSP: 2.0000 | Freq: Every day | NASAL | 0 refills | Status: DC

## 2018-04-11 NOTE — Patient Instructions (Signed)
mucinex Little noses Continue zyrtec and singulair

## 2018-04-11 NOTE — Progress Notes (Signed)
Name: Alexandra Crane   MRN: [id number]    DOB: 1995-09-17   Date:04/11/2018       Progress Note  Subjective  Chief Complaint  Chief Complaint  Patient presents with  . URI    Congestion, watery eyes, cough    HPI  URI: she developed a cry cough, followed by nasal congestion, rhinorrhea, watery eyes ,  sneezing, ears are clogged and feels full. She denies fever or chills. She denies fatigue. Appetite is normal. She has been taking zyrtec, singulair and ranitidine without help. She does not smoke, vapes, but she works in Audiological scientist.    Patient Active Problem List   Diagnosis Date Noted  . Allergic rhinitis 10/25/2016  . Snoring 08/08/2016  . Elevated hematocrit 08/08/2016  . 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 Prediabetes 07/09/2016  . Family planning 11/22/2014  . Flexural eczema 11/22/2014  . 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 Obesity (BMI 35.0-39.9 without comorbidity) 11/22/2014    History reviewed. No pertinent surgical history.  Family History  Problem Relation Age of Onset  . Hypertension Mother        borderline  . Hypothyroidism Mother   . Hypertension Father        borderline  . Hypothyroidism Sister   . Hypertension Maternal Grandmother   . Alzheimer's disease Maternal Grandmother   . Alzheimer's disease Maternal Grandfather     Social History   Socioeconomic History  . Marital status: Single    Spouse name: Not on file  . Number of children: Not on file  . Years of education: Not on file  . Highest education level: Not on file  Occupational History  . Not on file  Social Needs  . Financial resource strain: Not on file  . Food insecurity:    Worry: Not on file    Inability: Not on file  . Transportation needs:    Medical: Not on file    Non-medical: Not on file  Tobacco Use  . Smoking status: Never Smoker  . Smokeless tobacco: Never Used  Substance and Sexual Activity  . Alcohol use: No    Alcohol/week: 0.0 standard drinks  . Drug  use: No  . Sexual activity: Never  Lifestyle  . Physical activity:    Days per week: Not on file    Minutes per session: Not on file  . Stress: Not on file  Relationships  . Social connections:    Talks on phone: Not on file    Gets together: Not on file    Attends religious service: Not on file    Active member of club or organization: Not on file    Attends meetings of clubs or organizations: Not on file    Relationship status: Not on file  . Intimate partner violence:    Fear of current or ex partner: Not on file    Emotionally abused: Not on file    Physically abused: Not on file    Forced sexual activity: Not on file  Other Topics Concern  . Not on file  Social History Narrative  . Not on file     Current Outpatient Medications:  .  cetirizine (ZYRTEC) 10 MG tablet, Take 10 mg by mouth daily., Disp: , Rfl:  .  ibuprofen (ADVIL,MOTRIN) 200 MG tablet, Take 200 mg by mouth every 6 (six) hours as needed. Taking 200 mg -400 mg tablet as needed depending on pain, Disp: , Rfl:  .  montelukast (SINGULAIR) 10 MG tablet, Take 10 mg  by mouth at bedtime., Disp: , Rfl:  .  ranitidine (ZANTAC) 150 MG tablet, Take 150 mg by mouth daily., Disp: , Rfl:  .  SYNTHROID 100 MCG tablet, Take 1.5 tablets by mouth 1 day a week, take 1 tablet by mouth once daily on remaining days, Disp: 96 tablet, Rfl: 0 .  triamcinolone cream (KENALOG) 0.1 %, Apply 1 application topically 2 (two) times daily., Disp: 30 g, Rfl: 0  No Known Allergies  I personally reviewed active problem list, medication list, allergies, family history, social history with the patient/caregiver today.   ROS  Ten systems reviewed and is negative except as mentioned in HPI   Objective  Vitals:   04/11/18 1116  BP: 110/72  Pulse: 99  Temp: 98.6 F (37 C)  SpO2: 99%  Weight: 201 lb 12.8 oz (91.5 kg)  Height: 5\' 1"  (1.549 m)    Body mass index is 38.13 kg/m.  Physical Exam  Constitutional: Patient appears  well-developed and well-nourished. Obese No distress.  HEENT: head atraumatic, normocephalic, pupils equal and reactive to light, ears normal TM bilaterally,  neck supple, throat within normal limits, boggy and swollen turbinates, clear rhinorrhea  Cardiovascular: Normal rate, regular rhythm and normal heart sounds.  No murmur heard. No BLE edema. Pulmonary/Chest: Effort normal and breath sounds normal. No respiratory distress. Abdominal: Soft.  There is no tenderness. Psychiatric: Patient has a normal mood and affect. behavior is normal. Judgment and thought content normal.  Recent Results (from the past 2160 hour(s))  TSH     Status: Abnormal   Collection Time: 03/14/18  9:28 AM  Result Value Ref Range   TSH 8.77 (H) mIU/L    Comment:           Reference Range .           > or = 20 Years  0.40-4.50 .                Pregnancy Ranges           First trimester    0.26-2.66           Second trimester   0.55-2.73           Third trimester    0.43-2.91   Hemoglobin A1c     Status: None   Collection Time: 03/14/18  9:28 AM  Result Value Ref Range   Hgb A1c MFr Bld 5.4 <5.7 % of total Hgb    Comment: For the purpose of screening for the presence of diabetes: . <5.7%       Consistent with the absence of diabetes 5.7-6.4%    Consistent with increased risk for diabetes             (prediabetes) > or =6.5%  Consistent with diabetes . This assay result is consistent with a decreased risk of diabetes. . Currently, no consensus exists regarding use of hemoglobin A1c for diagnosis of diabetes in children. . According to American Diabetes Association (ADA) guidelines, hemoglobin A1c <7.0% represents optimal control in non-pregnant diabetic patients. Different metrics may apply to specific patient populations.  Standards of Medical Care in Diabetes(ADA). .    Mean Plasma Glucose 108 (calc)   eAG (mmol/L) 6.0 (calc)  COMPLETE METABOLIC PANEL WITH GFR     Status: None   Collection  Time: 03/14/18  9:28 AM  Result Value Ref Range   Glucose, Bld 83 65 - 99 mg/dL    Comment: .  Fasting reference interval .    BUN 10 7 - 25 mg/dL   Creat 8.29 5.62 - 1.30 mg/dL   GFR, Est Non African American 88 > OR = 60 mL/min/1.74m2   GFR, Est African American 103 > OR = 60 mL/min/1.63m2   BUN/Creatinine Ratio NOT APPLICABLE 6 - 22 (calc)   Sodium 139 135 - 146 mmol/L   Potassium 4.2 3.5 - 5.3 mmol/L   Chloride 103 98 - 110 mmol/L   CO2 27 20 - 32 mmol/L   Calcium 9.3 8.6 - 10.2 mg/dL   Total Protein 6.8 6.1 - 8.1 g/dL   Albumin 4.3 3.6 - 5.1 g/dL   Globulin 2.5 1.9 - 3.7 g/dL (calc)   AG Ratio 1.7 1.0 - 2.5 (calc)   Total Bilirubin 0.4 0.2 - 1.2 mg/dL   Alkaline phosphatase (APISO) 83 33 - 115 U/L   AST 18 10 - 30 U/L   ALT 19 6 - 29 U/L  CBC w/Diff/Platelet     Status: None   Collection Time: 03/14/18  9:28 AM  Result Value Ref Range   WBC 7.7 3.8 - 10.8 Thousand/uL   RBC 5.04 3.80 - 5.10 Million/uL   Hemoglobin 13.6 11.7 - 15.5 g/dL   HCT 86.5 78.4 - 69.6 %   MCV 82.9 80.0 - 100.0 fL   MCH 27.0 27.0 - 33.0 pg   MCHC 32.5 32.0 - 36.0 g/dL   RDW 29.5 28.4 - 13.2 %   Platelets 377 140 - 400 Thousand/uL   MPV 9.2 7.5 - 12.5 fL   Neutro Abs 5,159 1,500 - 7,800 cells/uL   Lymphs Abs 1,771 850 - 3,900 cells/uL   WBC mixed population 624 200 - 950 cells/uL   Eosinophils Absolute 100 15 - 500 cells/uL   Basophils Absolute 46 0 - 200 cells/uL   Neutrophils Relative % 67 %   Total Lymphocyte 23.0 %   Monocytes Relative 8.1 %   Eosinophils Relative 1.3 %   Basophils Relative 0.6 %  Lipid panel     Status: Abnormal   Collection Time: 03/14/18  9:28 AM  Result Value Ref Range   Cholesterol 160 <200 mg/dL   HDL 46 (L) >44 mg/dL   Triglycerides 010 <272 mg/dL   LDL Cholesterol (Calc) 91 mg/dL (calc)    Comment: Reference range: <100 . Desirable range <100 mg/dL for primary prevention;   <70 mg/dL for patients with CHD or diabetic patients  with > or = 2 CHD  risk factors. Marland Kitchen LDL-C is now calculated using the Martin-Hopkins  calculation, which is a validated novel method providing  better accuracy than the Friedewald equation in the  estimation of LDL-C.  Horald Pollen et al. Lenox Ahr. 5366;440(34): 2061-2068  (http://education.[facility].com/faq/FAQ164)    Total CHOL/HDL Ratio 3.5 <5.0 (calc)   Non-HDL Cholesterol (Calc) 114 <130 mg/dL (calc)    Comment: For patients with diabetes plus 1 major ASCVD risk  factor, treating to a non-HDL-C goal of <100 mg/dL  (LDL-C of <74 mg/dL) is considered a therapeutic  option.      PHQ2/9: Depression screen [facility] 2/9 04/11/2018 03/14/2018 03/14/2018 04/08/2017 10/25/2016  Decreased Interest 0 0 0 0 0  Down, Depressed, Hopeless 0 0 0 0 0  PHQ - 2 Score 0 0 0 0 0  Altered sleeping 0 0 - - -  Tired, decreased energy 0 0 - - -  Change in appetite 0 0 - - -  Feeling bad or failure about yourself  0 0 - - -  Trouble concentrating 0 0 - - -  Moving slowly or fidgety/restless 0 0 - - -  Suicidal thoughts 0 0 - - -  PHQ-9 Score 0 0 - - -  Difficult doing work/chores Not difficult at all Not difficult at all - - -     Fall Risk: Fall Risk  04/11/2018 03/14/2018 04/08/2017 10/25/2016 08/07/2016  Falls in the past year? 0 0 No No No  Number falls in past yr: - 0 - - -     Functional Status Survey: Is the patient deaf or have difficulty hearing?: No Does the patient have difficulty seeing, even when wearing glasses/contacts?: No Does the patient have difficulty concentrating, remembering, or making decisions?: No Does the patient have difficulty walking or climbing stairs?: No Does the patient have difficulty dressing or bathing?: No Does the patient have difficulty doing errands alone such as visiting a doctor's office or shopping?: No   Assessment & Plan   1. Viral upper respiratory tract infection  - fluticasone (FLONASE) 50 MCG/ACT nasal spray; Place 2 sprays into both nostrils daily.  Dispense:  16 g; Refill: 0 - benzonatate (TESSALON) 100 MG capsule; Take 1-2 capsules (100-200 mg total) by mouth 2 (two) times daily as needed.  Dispense: 40 capsule; Refill: 0

## 2018-04-28 ENCOUNTER — Telehealth: Payer: Self-pay | Admitting: Family Medicine

## 2018-04-28 NOTE — Telephone Encounter (Signed)
Left message for patient to come by for labs 

## 2018-04-28 NOTE — Telephone Encounter (Signed)
-----   Message from Doren CustardEmily E Atreus Hasz, FNP sent at 03/18/2018  7:20 AM EST ----- Regarding: Call patient to come in for TSH recheck Call patient to come in for TSH recheck

## 2018-05-04 ENCOUNTER — Other Ambulatory Visit: Payer: Self-pay | Admitting: Family Medicine

## 2018-05-04 DIAGNOSIS — J069 Acute upper respiratory infection, unspecified: Secondary | ICD-10-CM

## 2018-06-09 ENCOUNTER — Other Ambulatory Visit: Payer: Self-pay | Admitting: Family Medicine

## 2018-06-09 DIAGNOSIS — E038 Other specified hypothyroidism: Secondary | ICD-10-CM

## 2018-06-09 NOTE — Telephone Encounter (Signed)
Please let patient know she needs to come in for labs to get additional refills.

## 2018-06-18 NOTE — Addendum Note (Signed)
Addended by: Doren CustardBOYCE, EMILY E on: 06/18/2018 01:48 PM   Modules accepted: Orders

## 2018-06-18 NOTE — Telephone Encounter (Signed)
Orders placed.

## 2018-06-18 NOTE — Telephone Encounter (Signed)
Can you please order labs and I will call her to notify her to come in. Last ordered labs were in November 2019.

## 2018-06-24 NOTE — Telephone Encounter (Signed)
Left message for patient to come by office for labs

## 2018-07-30 ENCOUNTER — Ambulatory Visit: Payer: Commercial Managed Care - PPO | Admitting: Obstetrics & Gynecology

## 2018-07-30 ENCOUNTER — Other Ambulatory Visit: Payer: Self-pay | Admitting: Family Medicine

## 2018-07-30 DIAGNOSIS — E038 Other specified hypothyroidism: Secondary | ICD-10-CM

## 2018-09-12 ENCOUNTER — Ambulatory Visit: Payer: Commercial Managed Care - PPO | Admitting: Family Medicine

## 2018-09-12 ENCOUNTER — Other Ambulatory Visit: Payer: Self-pay | Admitting: Emergency Medicine

## 2018-09-12 MED ORDER — MONTELUKAST SODIUM 10 MG PO TABS: 10.0000 mg | ORAL_TABLET | Freq: Every day | ORAL | 0 refills | Status: DC

## 2018-09-26 ENCOUNTER — Other Ambulatory Visit: Payer: Self-pay | Admitting: Family Medicine

## 2018-11-02 ENCOUNTER — Telehealth: Payer: Self-pay | Admitting: Family Medicine

## 2018-11-02 DIAGNOSIS — E038 Other specified hypothyroidism: Secondary | ICD-10-CM

## 2018-11-03 NOTE — Telephone Encounter (Signed)
Please schedule patient for follow up in the next 15 days.  

## 2018-11-03 NOTE — Telephone Encounter (Signed)
Pt informed that a limited supply has been sent to the pharmacy and for her to schedule an appt. Pt will check her schedule and give [place] a call back.

## 2018-11-24 ENCOUNTER — Other Ambulatory Visit: Payer: Self-pay | Admitting: Family Medicine

## 2018-11-24 DIAGNOSIS — E038 Other specified hypothyroidism: Secondary | ICD-10-CM

## 2018-11-24 MED ORDER — SYNTHROID 100 MCG PO TABS: ORAL_TABLET | ORAL | 0 refills | Status: DC

## 2018-11-24 NOTE — Telephone Encounter (Signed)
Pt needs refill on thyroid meds to be sent to [facility] [place]. Pt does have an appt for 12/09/2018 and only has enough meds for 5 days. [phone]

## 2018-12-01 ENCOUNTER — Other Ambulatory Visit: Payer: Self-pay

## 2018-12-01 DIAGNOSIS — E038 Other specified hypothyroidism: Secondary | ICD-10-CM

## 2018-12-01 MED ORDER — SYNTHROID 100 MCG PO TABS: ORAL_TABLET | ORAL | 0 refills | Status: DC

## 2018-12-04 ENCOUNTER — Telehealth: Payer: Self-pay | Admitting: Family Medicine

## 2018-12-04 DIAGNOSIS — E038 Other specified hypothyroidism: Secondary | ICD-10-CM

## 2018-12-04 NOTE — Telephone Encounter (Signed)
Pt has an appt on 12/09/2018 °

## 2018-12-04 NOTE — Telephone Encounter (Signed)
She needs to come in for labs and follow up. Please schedule.

## 2018-12-09 ENCOUNTER — Encounter: Payer: Self-pay | Admitting: Family Medicine

## 2018-12-09 ENCOUNTER — Other Ambulatory Visit: Payer: Self-pay

## 2018-12-09 ENCOUNTER — Ambulatory Visit: Payer: Commercial Managed Care - PPO | Admitting: Family Medicine

## 2018-12-09 VITALS — BP 118/72 | HR 94 | Temp 97.7°F | Resp 16 | Ht 61.0 in | Wt 210.4 lb

## 2018-12-09 DIAGNOSIS — L2082 Flexural eczema: Secondary | ICD-10-CM | POA: Diagnosis not present

## 2018-12-09 DIAGNOSIS — E669 Obesity, unspecified: Secondary | ICD-10-CM

## 2018-12-09 DIAGNOSIS — R7303 Prediabetes: Secondary | ICD-10-CM

## 2018-12-09 DIAGNOSIS — R718 Other abnormality of red blood cells: Secondary | ICD-10-CM

## 2018-12-09 DIAGNOSIS — E038 Other specified hypothyroidism: Secondary | ICD-10-CM

## 2018-12-09 DIAGNOSIS — E783 Hyperchylomicronemia: Secondary | ICD-10-CM

## 2018-12-09 NOTE — Patient Instructions (Signed)
Alexandra Crane: [phone] - call to set up nutritionist appointment. If you need a new referral, please message me on MyChart or call the office.

## 2018-12-09 NOTE — Progress Notes (Signed)
Name: Alexandra Crane   MRN: [id number]    DOB: 04/18/1996   Date:12/09/2018       Progress Note  Subjective  Chief Complaint  Chief Complaint  Patient presents with  . Follow-up    HPI  Pt presents for follow up.   Hypothyroidism: She was diagnosed at age 23yo; misses about 3-4 pills a month. She denies having any hair/skin/nail changes, constipation/diarrhea, palpitations, has gained about 9lbs. Is overdue for labs at this time. Taking 1.5 tablets on Sundays and 1 tablet all other days of the 100mcg tablets.  Obesity & Prediabetes: Does not exercise and is not motivated to walk at this time. Mom cooks at home for the most part; eats some salads at home, tends to eat burgers/fries/chicken/fast food as well.  Mom notes that pt's dad brings a lot of junk food in to the house.   Eczema: No current outbreak; uses triamcinolone; usually only in her AC and popliteal fossas.  Unchanged.  Hx elevated HCT - was back to normal at last check. We will recheck today  Eelvated TGD's - in 2018; we will recheck today. Her diet has been quite poor - eating out a lot, gained 9lbs since last visit.  Patient Active Problem List   Diagnosis Date Noted  . Allergic rhinitis 10/25/2016  . Snoring 08/08/2016  . Elevated hematocrit 08/08/2016  . 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 Prediabetes 07/09/2016  . Family planning 11/22/2014  . Flexural eczema 11/22/2014  . 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 Obesity (BMI 35.0-39.9 without comorbidity) 11/22/2014    No past surgical history on file.  Family History  Problem Relation Age of Onset  . Hypertension Mother        borderline  . Hypothyroidism Mother   . Hypertension Father        borderline  . Hypothyroidism Sister   . Hypertension Maternal Grandmother   . Alzheimer's disease Maternal Grandmother   . Alzheimer's disease Maternal Grandfather     Social History   Socioeconomic History  . Marital status: Single    Spouse  name: Not on file  . Number of children: Not on file  . Years of education: Not on file  . Highest education level: Not on file  Occupational History  . Not on file  Social Needs  . Financial resource strain: Not on file  . Food insecurity    Worry: Not on file    Inability: Not on file  . Transportation needs    Medical: Not on file    Non-medical: Not on file  Tobacco Use  . Smoking status: Never Smoker  . Smokeless tobacco: Never Used  Substance and Sexual Activity  . Alcohol use: No    Alcohol/week: 0.0 standard drinks  . Drug use: No  . Sexual activity: Never  Lifestyle  . Physical activity    Days per week: Not on file    Minutes per session: Not on file  . Stress: Not on file  Relationships  . Social Musicianconnections    Talks on phone: Not on file    Gets together: Not on file    Attends religious service: Not on file    Active member of club or organization: Not on file    Attends meetings of clubs or organizations: Not on file    Relationship status: Not on file  . Intimate partner violence    Fear of current or ex partner: Not on file    Emotionally abused: Not  on file    Physically abused: Not on file    Forced sexual activity: Not on file  Other Topics Concern  . Not on file  Social History Narrative  . Not on file     Current Outpatient Medications:  .  cetirizine (ZYRTEC) 10 MG tablet, Take 10 mg by mouth daily., Disp: , Rfl:  .  fluticasone (FLONASE) 50 MCG/ACT nasal spray, SPRAY 2 SPRAYS INTO EACH NOSTRIL EVERY DAY, Disp: 16 g, Rfl: 0 .  ibuprofen (ADVIL,MOTRIN) 200 MG tablet, Take 200 mg by mouth every 6 (six) hours as needed. Taking 200 mg -400 mg tablet as needed depending on pain, Disp: , Rfl:  .  montelukast (SINGULAIR) 10 MG tablet, Take 1 tablet (10 mg total) by mouth at bedtime., Disp: 30 tablet, Rfl: 0 .  SYNTHROID 100 MCG tablet, Take one daily, Disp: 15 tablet, Rfl: 0 .  triamcinolone cream (KENALOG) 0.1 %, Apply 1 application topically 2  (two) times daily., Disp: 30 g, Rfl: 0 .  benzonatate (TESSALON) 100 MG capsule, Take 1-2 capsules (100-200 mg total) by mouth 2 (two) times daily as needed. (Patient not taking: Reported on 12/09/2018), Disp: 40 capsule, Rfl: 0 .  ranitidine (ZANTAC) 150 MG tablet, Take 150 mg by mouth daily., Disp: , Rfl:   No Known Allergies  I personally reviewed active problem list, medication list, allergies, health maintenance, notes from last encounter, lab results with the patient/caregiver today.   ROS  Constitutional: Negative for fever or weight change.  Respiratory: Negative for cough and shortness of breath.   Cardiovascular: Negative for chest pain or palpitations.  Gastrointestinal: Negative for abdominal pain, no bowel changes.  Musculoskeletal: Negative for gait problem or joint swelling.  Skin: Negative for rash.  Neurological: Negative for dizziness or headache.  No other specific complaints in a complete review of systems (except as listed in HPI above).  Objective  Vitals:   12/09/18 0735  BP: 118/72  Pulse: 94  Resp: 16  Temp: 97.7 F (36.5 C)  TempSrc: Oral  SpO2: 97%  Weight: 210 lb 6.4 oz (95.4 kg)  Height: 5\' 1"  (1.549 m)    Body mass index is 39.75 kg/m.  Physical Exam Constitutional: Patient appears well-developed and well-nourished. No distress.  HENT: Head: Normocephalic and atraumatic. Eyes: Conjunctivae and EOM are normal. No scleral icterus. Neck: Normal range of motion. Neck supple. No JVD present. No thyromegaly present.  Cardiovascular: Normal rate, regular rhythm and normal heart sounds.  No murmur heard. No BLE edema. Pulmonary/Chest: Effort normal and breath sounds normal. No respiratory distress. Musculoskeletal: Normal range of motion, no joint effusions. No gross deformities Neurological: Pt is alert and oriented to person, place, and time. No cranial nerve deficit. Coordination, balance, strength, speech and gait are normal.  Skin: Skin is warm  and dry. No rash noted. No erythema.  Psychiatric: Patient has a normal mood and affect. behavior is normal. Judgment and thought content normal.  No results found for this or any previous visit (from the past 72 hour(s)).  PHQ2/9: Depression screen [facility] 2/9 12/09/2018 04/11/2018 03/14/2018 03/14/2018 04/08/2017  Decreased Interest 0 0 0 0 0  Down, Depressed, Hopeless 0 0 0 0 0  PHQ - 2 Score 0 0 0 0 0  Altered sleeping 0 0 0 - -  Tired, decreased energy 0 0 0 - -  Change in appetite 0 0 0 - -  Feeling bad or failure about yourself  0 0 0 - -  Trouble  concentrating 0 0 0 - -  Moving slowly or fidgety/restless 0 0 0 - -  Suicidal thoughts 0 0 0 - -  PHQ-9 Score 0 0 0 - -  Difficult doing work/chores Not difficult at all Not difficult at all Not difficult at all - -   PHQ-2/9 Result is negative.    Fall Risk: Fall Risk  12/09/2018 04/11/2018 03/14/2018 04/08/2017 10/25/2016  Falls in the past year? 0 0 0 No No  Number falls in past yr: 0 - 0 - -  Injury with Fall? 0 - - - -  Follow up Falls evaluation completed - - - -   Assessment & Plan  1. Hypothyroidism, juvenile - TSH  2. Obesity (BMI 35.0-39.9 without comorbidity) - Discussed importance of 150 minutes of physical activity weekly, eat two servings of fish weekly, eat one serving of tree nuts ( cashews, pistachios, pecans, almonds.Marland Kitchen.) every other day, eat 6 servings of fruit/vegetables daily and drink plenty of water and avoid sweet beverages.  - Hemoglobin A1c - COMPLETE METABOLIC PANEL WITH GFR  3. Prediabetes - She is planning to call nutritionist to set up a visit. - Hemoglobin A1c  4. Flexural eczema - Doing well, no outbreaks.  5. Elevated hematocrit - CBC with Differential/Platelet  6. Exogenous hypertriglyceridemia - Lipid panel

## 2018-12-10 ENCOUNTER — Other Ambulatory Visit: Payer: Self-pay | Admitting: Family Medicine

## 2018-12-10 DIAGNOSIS — E038 Other specified hypothyroidism: Secondary | ICD-10-CM

## 2018-12-10 LAB — CBC WITH DIFFERENTIAL/PLATELET
Absolute Monocytes: 677 cells/uL (ref 200–950)
Basophils Absolute: 54 cells/uL (ref 0–200)
Basophils Relative: 0.8 %
Eosinophils Absolute: 174 cells/uL (ref 15–500)
Eosinophils Relative: 2.6 %
HCT: 41.8 % (ref 35.0–45.0)
Hemoglobin: 13.7 g/dL (ref 11.7–15.5)
Lymphs Abs: 2251 cells/uL (ref 850–3900)
MCH: 27 pg (ref 27.0–33.0)
MCHC: 32.8 g/dL (ref 32.0–36.0)
MCV: 82.3 fL (ref 80.0–100.0)
MPV: 9.6 fL (ref 7.5–12.5)
Monocytes Relative: 10.1 %
Neutro Abs: 3544 cells/uL (ref 1500–7800)
Neutrophils Relative %: 52.9 %
Platelets: 364 10*3/uL (ref 140–400)
RBC: 5.08 10*6/uL (ref 3.80–5.10)
RDW: 12 % (ref 11.0–15.0)
Total Lymphocyte: 33.6 %
WBC: 6.7 10*3/uL (ref 3.8–10.8)

## 2018-12-10 LAB — LIPID PANEL
Cholesterol: 140 mg/dL (ref <200)
HDL: 49 mg/dL — ABNORMAL LOW (ref >=50)
LDL Cholesterol (Calc): 73 mg/dL (calc)
Non-HDL Cholesterol (Calc): 91 mg/dL (calc) (ref <130)
Total CHOL/HDL Ratio: 2.9 (calc) (ref <5.0)
Triglycerides: 101 mg/dL (ref <150)

## 2018-12-10 LAB — COMPLETE METABOLIC PANEL WITH GFR
AG Ratio: 1.7 (calc) (ref 1.0–2.5)
ALT: 23 U/L (ref 6–29)
AST: 18 U/L (ref 10–30)
Albumin: 4.5 g/dL (ref 3.6–5.1)
Alkaline phosphatase (APISO): 85 U/L (ref 31–125)
BUN: 11 mg/dL (ref 7–25)
CO2: 24 mmol/L (ref 20–32)
Calcium: 9.6 mg/dL (ref 8.6–10.2)
Chloride: 104 mmol/L (ref 98–110)
Creat: 0.8 mg/dL (ref 0.50–1.10)
GFR, Est African American: 122 mL/min/{1.73_m2} (ref >=60)
GFR, Est Non African American: 105 mL/min/{1.73_m2} (ref >=60)
Globulin: 2.7 g/dL (calc) (ref 1.9–3.7)
Glucose, Bld: 97 mg/dL (ref 65–99)
Potassium: 4.3 mmol/L (ref 3.5–5.3)
Sodium: 138 mmol/L (ref 135–146)
Total Bilirubin: 0.2 mg/dL (ref 0.2–1.2)
Total Protein: 7.2 g/dL (ref 6.1–8.1)

## 2018-12-10 LAB — HEMOGLOBIN A1C
Hgb A1c MFr Bld: 5.5 % of total Hgb (ref <5.7)
Mean Plasma Glucose: 111 (calc)
eAG (mmol/L): 6.2 (calc)

## 2018-12-10 LAB — TSH: TSH: 7.99 mIU/L — ABNORMAL HIGH

## 2018-12-10 MED ORDER — SYNTHROID 100 MCG PO TABS: ORAL_TABLET | ORAL | 0 refills | Status: DC

## 2019-01-02 ENCOUNTER — Other Ambulatory Visit: Payer: Self-pay | Admitting: Family Medicine

## 2019-01-02 DIAGNOSIS — E038 Other specified hypothyroidism: Secondary | ICD-10-CM

## 2019-01-23 ENCOUNTER — Encounter: Payer: BC Managed Care – PPO | Admitting: Family Medicine

## 2019-02-13 ENCOUNTER — Other Ambulatory Visit: Payer: Self-pay

## 2019-02-13 ENCOUNTER — Encounter: Payer: Self-pay | Admitting: Family Medicine

## 2019-02-13 ENCOUNTER — Other Ambulatory Visit: Payer: Self-pay | Admitting: Family Medicine

## 2019-02-13 ENCOUNTER — Other Ambulatory Visit (HOSPITAL_COMMUNITY)
Admission: RE | Admit: 2019-02-13 | Discharge: 2019-02-13 | Disposition: A | Discharge status: Home / Self Care | Payer: BC Managed Care – PPO | Source: Ambulatory Visit | Attending: Family Medicine | Admitting: Family Medicine

## 2019-02-13 ENCOUNTER — Ambulatory Visit (INDEPENDENT_AMBULATORY_CARE_PROVIDER_SITE_OTHER): Payer: BC Managed Care – PPO | Admitting: Family Medicine

## 2019-02-13 VITALS — BP 126/62 | Temp 97.7°F | Resp 16 | Ht 61.0 in | Wt 210.9 lb

## 2019-02-13 DIAGNOSIS — Z01419 Encounter for gynecological examination (general) (routine) without abnormal findings: Secondary | ICD-10-CM

## 2019-02-13 DIAGNOSIS — Z113 Encounter for screening for infections with a predominantly sexual mode of transmission: Secondary | ICD-10-CM | POA: Diagnosis present

## 2019-02-13 DIAGNOSIS — Z1159 Encounter for screening for other viral diseases: Secondary | ICD-10-CM

## 2019-02-13 DIAGNOSIS — E038 Other specified hypothyroidism: Secondary | ICD-10-CM

## 2019-02-13 NOTE — Progress Notes (Signed)
Name: Alexandra Crane   MRN: [id number]    DOB: [birth year]   Date:02/13/2019       Progress Note  Subjective  Chief Complaint  Chief Complaint  Patient presents with  . Annual Exam  . Medication Refill    HPI  Patient presents for annual CPE.  Diet: Balanced Exercise: Started walking again   USPSTF grade A and B recommendations    Office Visit from 02/13/2019 in [facility]  AUDIT-C Score  0     Depression: Phq 9 is  negative Depression screen [facility] 2/9 02/13/2019 12/09/2018 04/11/2018 03/14/2018 03/14/2018  Decreased Interest 1 0 0 0 0  Down, Depressed, Hopeless 0 0 0 0 0  PHQ - 2 Score 1 0 0 0 0  Altered sleeping 1 0 0 0 -  Tired, decreased energy 0 0 0 0 -  Change in appetite 1 0 0 0 -  Feeling bad or failure about yourself  1 0 0 0 -  Trouble concentrating 0 0 0 0 -  Moving slowly or fidgety/restless 0 0 0 0 -  Suicidal thoughts 0 0 0 0 -  PHQ-9 Score 4 0 0 0 -  Difficult doing work/chores Not difficult at all Not difficult at all Not difficult at all Not difficult at all -   Hypertension: BP Readings from Last 3 Encounters:  02/13/19 126/62  12/09/18 118/72  04/11/18 110/72   Obesity: Wt Readings from Last 3 Encounters:  02/13/19 210 lb 14.4 oz (95.7 kg)  12/09/18 210 lb 6.4 oz (95.4 kg)  04/11/18 201 lb 12.8 oz (91.5 kg)   BMI Readings from Last 3 Encounters:  02/13/19 39.85 kg/m  12/09/18 39.75 kg/m  04/11/18 38.13 kg/m    Hep C Screening: Needs this done today STD testing and prevention (HIV/chl/gon/syphilis): 1 partner in the last year; will check labs today Intimate partner violence: No concners Sexual History/Pain during Intercourse: No concerns Menstrual History/LMP/Abnormal Bleeding: Periods are regular.  She is not using birth control aside from condoms.  Incontinence Symptoms: No concerns  Breast cancer: No family history Osteoporosis Screening: No history vit D deficiency; no family history of osteoporosis; discussed weight  bearing exercises.  Cervical cancer screening: Due today for 1st pap; no prior hx  Skin cancer: No concerning lesions Colorectal cancer: Denies family or personal history of colorectal cancer, no changes in BM's - no blood in stool, dark and tarry stool, mucus in stool, or constipation/diarrhea. Lung cancer:  Never smoker/vaping  ECG: Denies chest pain, shortness of breath, palpitations  Advanced Care Planning: A voluntary discussion about advance care planning including the explanation and discussion of advance directives.  Discussed health care proxy and Living will, and the patient was able to identify a health care proxy as Alexandra Crane.  Patient does not have a living will at present time. If patient does have living will, I have requested they bring this to the clinic to be scanned in to their chart.  Lipids: Lab Results  Component Value Date   CHOL 140 12/09/2018   CHOL 160 03/14/2018   CHOL 156 07/09/2016   Lab Results  Component Value Date   HDL 49 (L) 12/09/2018   HDL 46 (L) 03/14/2018   HDL 43 (L) 07/09/2016   Lab Results  Component Value Date   LDLCALC 73 12/09/2018   LDLCALC 91 03/14/2018   LDLCALC 74 07/09/2016   Lab Results  Component Value Date   TRIG 101 12/09/2018   TRIG 124  03/14/2018   TRIG 195 (H) 07/09/2016   Lab Results  Component Value Date   CHOLHDL 2.9 12/09/2018   CHOLHDL 3.5 03/14/2018   CHOLHDL 3.6 07/09/2016   No results found for: LDLDIRECT  Glucose: Glucose  Date Value Ref Range Status  04/22/2013 107 (H) 65 - 99 mg/dL Final   Glucose, Bld  Date Value Ref Range Status  12/09/2018 97 65 - 99 mg/dL Final    Comment:    .            Fasting reference interval .   03/14/2018 83 65 - 99 mg/dL Final    Comment:    .            Fasting reference interval .   08/07/2016 90 65 - 99 mg/dL Final    Patient Active Problem List   Diagnosis Date Noted  . Allergic rhinitis 10/25/2016  . Snoring 08/08/2016  . Elevated hematocrit  08/08/2016  . 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 Prediabetes 07/09/2016  . Family planning 11/22/2014  . Flexural eczema 11/22/2014  . 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 Obesity (BMI 35.0-39.9 without comorbidity) 11/22/2014    History reviewed. No pertinent surgical history.  Family History  Problem Relation Age of Onset  . Hypertension Mother        borderline  . Hypothyroidism Mother   . Hypertension Father        borderline  . Hypothyroidism Sister   . Hypertension Maternal Grandmother   . Alzheimer's disease Maternal Grandmother   . Alzheimer's disease Maternal Grandfather     Social History   Socioeconomic History  . Marital status: Single    Spouse name: Not on file  . Number of children: Not on file  . Years of education: Not on file  . Highest education level: Not on file  Occupational History  . Not on file  Social Needs  . Financial resource strain: Not on file  . Food insecurity    Worry: Not on file    Inability: Not on file  . Transportation needs    Medical: Not on file    Non-medical: Not on file  Tobacco Use  . Smoking status: Never Smoker  . Smokeless tobacco: Never Used  Substance and Sexual Activity  . Alcohol use: No    Alcohol/week: 0.0 standard drinks  . Drug use: No  . Sexual activity: Never  Lifestyle  . Physical activity    Days per week: Not on file    Minutes per session: Not on file  . Stress: Not on file  Relationships  . Social Herbalist on phone: Not on file    Gets together: Not on file    Attends religious service: Not on file    Active member of club or organization: Not on file    Attends meetings of clubs or organizations: Not on file    Relationship status: Not on file  . Intimate partner violence    Fear of current or ex partner: Not on file    Emotionally abused: Not on file    Physically abused: Not on file    Forced sexual activity: Not on file  Other Topics Concern  . Not on file   Social History Narrative  . Not on file     Current Outpatient Medications:  .  ibuprofen (ADVIL,MOTRIN) 200 MG tablet, Take 200 mg by mouth every 6 (six) hours as needed. Taking 200 mg -400 mg tablet as needed  depending on pain, Disp: , Rfl:  .  SYNTHROID 100 MCG tablet, TAKE 1 TABLET ONCE DAILY ON MON/WED/FRI/SAT, TAKE 1 AND 1/2 TABLET ON SUN/TUES/THURS., Disp: 68 tablet, Rfl: 0 .  triamcinolone cream (KENALOG) 0.1 %, Apply 1 application topically 2 (two) times daily., Disp: 30 g, Rfl: 0 .  cetirizine (ZYRTEC) 10 MG tablet, Take 10 mg by mouth daily., Disp: , Rfl:  .  fluticasone (FLONASE) 50 MCG/ACT nasal spray, SPRAY 2 SPRAYS INTO EACH NOSTRIL EVERY DAY (Patient not taking: Reported on 02/13/2019), Disp: 16 g, Rfl: 0 .  montelukast (SINGULAIR) 10 MG tablet, Take 1 tablet (10 mg total) by mouth at bedtime. (Patient not taking: Reported on 02/13/2019), Disp: 30 tablet, Rfl: 0 .  ranitidine (ZANTAC) 150 MG tablet, Take 150 mg by mouth daily., Disp: , Rfl:   No Known Allergies   ROS  Constitutional: Negative for fever or weight change.  Respiratory: Negative for cough and shortness of breath.   Cardiovascular: Negative for chest pain or palpitations.  Gastrointestinal: Negative for abdominal pain, no bowel changes.  Musculoskeletal: Negative for gait problem or joint swelling.  Skin: Negative for rash.  Neurological: Negative for dizziness or headache.  No other specific complaints in a complete review of systems (except as listed in HPI above).  Objective  Vitals:   02/13/19 1412  BP: 126/62  Resp: 16  Temp: 97.7 F (36.5 C)  SpO2: 98%  Weight: 210 lb 14.4 oz (95.7 kg)  Height:  (1.549 m)    Body mass index is 39.85 kg/m.  Physical Exam  Constitutional: Patient appears well-developed and well-nourished. No distress.  HENT: Head: Normocephalic and atraumatic. Ears: B TMs ok, no erythema or effusion; Nose: Nose normal. Mouth/Throat: Oropharynx is clear and moist. No  oropharyngeal exudate.  Eyes: Conjunctivae and EOM are normal. Pupils are equal, round, and reactive to light. No scleral icterus.  Neck: Normal range of motion. Neck supple. No JVD present. No thyromegaly present.  Cardiovascular: Normal rate, regular rhythm and normal heart sounds.  No murmur heard. No BLE edema. Pulmonary/Chest: Effort normal and breath sounds normal. No respiratory distress. Abdominal: Soft. Bowel sounds are normal, no distension. There is no tenderness. no masses Breast: no lumps or masses, no nipple discharge or rashes FEMALE GENITALIA:  External genitalia normal External urethra normal Vaginal vault normal without discharge or lesions Cervix normal without discharge or lesions Bimanual exam normal without masses RECTAL: no rectal masses or hemorrhoids Musculoskeletal: Normal range of motion, no joint effusions. No gross deformities Neurological: he is alert and oriented to person, place, and time. No cranial nerve deficit. Coordination, balance, strength, speech and gait are normal.  Skin: Skin is warm and dry. No rash noted. No erythema.  Psychiatric: Patient has a normal mood and affect. behavior is normal. Judgment and thought content normal.   Recent Results (from the past 2160 hour(s))  TSH     Status: Abnormal   Collection Time: 12/09/18  8:18 AM  Result Value Ref Range   TSH 7.99 (H) mIU/L    Comment:           Reference Range .           > or = 20 Years  0.40-4.50 .                Pregnancy Ranges           First trimester    0.26-2.66           Second  trimester   0.55-2.73           Third trimester    0.43-2.91   Hemoglobin A1c     Status: None   Collection Time: 12/09/18  8:18 AM  Result Value Ref Range   Hgb A1c MFr Bld 5.5 <5.7 % of total Hgb    Comment: For the purpose of screening for the presence of diabetes: . <5.7%       Consistent with the absence of diabetes 5.7-6.4%    Consistent with increased risk for diabetes              (prediabetes) > or =6.5%  Consistent with diabetes . This assay result is consistent with a decreased risk of diabetes. . Currently, no consensus exists regarding use of hemoglobin A1c for diagnosis of diabetes in children. . According to American Diabetes Association (ADA) guidelines, hemoglobin A1c <7.0% represents optimal control in non-pregnant diabetic patients. Different metrics may apply to specific patient populations.  Standards of Medical Care in Diabetes(ADA). .    Mean Plasma Glucose 111 (calc)   eAG (mmol/L) 6.2 (calc)  COMPLETE METABOLIC PANEL WITH GFR     Status: None   Collection Time: 12/09/18  8:18 AM  Result Value Ref Range   Glucose, Bld 97 65 - 99 mg/dL    Comment: .            Fasting reference interval .    BUN 11 7 - 25 mg/dL   Creat 7.82 9.56 - 2.13 mg/dL   GFR, Est Non African American 105 > OR = 60 mL/min/1.38m2   GFR, Est African American 122 > OR = 60 mL/min/1.37m2   BUN/Creatinine Ratio NOT APPLICABLE 6 - 22 (calc)   Sodium 138 135 - 146 mmol/L   Potassium 4.3 3.5 - 5.3 mmol/L   Chloride 104 98 - 110 mmol/L   CO2 24 20 - 32 mmol/L   Calcium 9.6 8.6 - 10.2 mg/dL   Total Protein 7.2 6.1 - 8.1 g/dL   Albumin 4.5 3.6 - 5.1 g/dL   Globulin 2.7 1.9 - 3.7 g/dL (calc)   AG Ratio 1.7 1.0 - 2.5 (calc)   Total Bilirubin 0.2 0.2 - 1.2 mg/dL   Alkaline phosphatase (APISO) 85 31 - 125 U/L   AST 18 10 - 30 U/L   ALT 23 6 - 29 U/L  Lipid panel     Status: Abnormal   Collection Time: 12/09/18  8:18 AM  Result Value Ref Range   Cholesterol 140 <200 mg/dL   HDL 49 (L) > OR = 50 mg/dL   Triglycerides 086 <578 mg/dL   LDL Cholesterol (Calc) 73 mg/dL (calc)    Comment: Reference range: <100 . Desirable range <100 mg/dL for primary prevention;   <70 mg/dL for patients with CHD or diabetic patients  with > or = 2 CHD risk factors. Marland Kitchen LDL-C is now calculated using the Martin-Hopkins  calculation, which is a validated novel method providing  better  accuracy than the Friedewald equation in the  estimation of LDL-C.  Horald Pollen et al. Lenox Ahr. 4696;295(28): 2061-2068  (http://education.[facility].com/faq/FAQ164)    Total CHOL/HDL Ratio 2.9 <5.0 (calc)   Non-HDL Cholesterol (Calc) 91 <413 mg/dL (calc)    Comment: For patients with diabetes plus 1 major ASCVD risk  factor, treating to a non-HDL-C goal of <100 mg/dL  (LDL-C of <24 mg/dL) is considered a therapeutic  option.   CBC with Differential/Platelet     Status: None   Collection  Time: 12/09/18  8:18 AM  Result Value Ref Range   WBC 6.7 3.8 - 10.8 Thousand/uL   RBC 5.08 3.80 - 5.10 Million/uL   Hemoglobin 13.7 11.7 - 15.5 g/dL   HCT 16.141.8 09.635.0 - 04.545.0 %   MCV 82.3 80.0 - 100.0 fL   MCH 27.0 27.0 - 33.0 pg   MCHC 32.8 32.0 - 36.0 g/dL   RDW 40.912.0 81.111.0 - 91.415.0 %   Platelets 364 140 - 400 Thousand/uL   MPV 9.6 7.5 - 12.5 fL   Neutro Abs 3,544 1,500 - 7,800 cells/uL   Lymphs Abs 2,251 850 - 3,900 cells/uL   Absolute Monocytes 677 200 - 950 cells/uL   Eosinophils Absolute 174 15 - 500 cells/uL   Basophils Absolute 54 0 - 200 cells/uL   Neutrophils Relative % 52.9 %   Total Lymphocyte 33.6 %   Monocytes Relative 10.1 %   Eosinophils Relative 2.6 %   Basophils Relative 0.8 %     Fall Risk: Fall Risk  02/13/2019 12/09/2018 04/11/2018 03/14/2018 04/08/2017  Falls in the past year? 0 0 0 0 No  Number falls in past yr: 0 0 - 0 -  Injury with Fall? 0 0 - - -  Follow up - Falls evaluation completed - - -    Functional Status Survey: Is the patient deaf or have difficulty hearing?: No Does the patient have difficulty seeing, even when wearing glasses/contacts?: No Does the patient have difficulty concentrating, remembering, or making decisions?: No Does the patient have difficulty walking or climbing stairs?: No Does the patient have difficulty dressing or bathing?: No Does the patient have difficulty doing errands alone such as visiting a doctor's office or shopping?:  No   Assessment & Plan  1. Well woman exam with routine gynecological exam -USPSTF grade A and B recommendations reviewed with patient; age-appropriate recommendations, preventive care, screening tests, etc discussed and encouraged; healthy living encouraged; see AVS for patient education given to patient -Discussed importance of 150 minutes of physical activity weekly, eat two servings of fish weekly, eat one serving of tree nuts ( cashews, pistachios, pecans, almonds.Marland Kitchen.) every other day, eat 6 servings of fruit/vegetables daily and drink plenty of water and avoid sweet beverages.  - Hepatitis C antibody - HIV Antibody (routine testing w rflx) - RPR - Cytology - PAP  2. Routine screening for STI (sexually transmitted infection) - HIV Antibody (routine testing w rflx) - RPR - Cytology - PAP  3. Need for hepatitis C screening test - Hepatitis C antibody  4. Hypothyroidism, juvenile - TSH

## 2019-02-16 ENCOUNTER — Other Ambulatory Visit: Payer: Self-pay | Admitting: Family Medicine

## 2019-02-16 DIAGNOSIS — E038 Other specified hypothyroidism: Secondary | ICD-10-CM

## 2019-02-16 MED ORDER — SYNTHROID 100 MCG PO TABS: ORAL_TABLET | ORAL | 1 refills | Status: DC

## 2019-02-17 LAB — HIV ANTIBODY (ROUTINE TESTING W REFLEX): HIV 1&2 Ab, 4th Generation: NONREACTIVE

## 2019-02-17 LAB — RPR: RPR Ser Ql: NONREACTIVE

## 2019-02-17 LAB — HEPATITIS C ANTIBODY
Hepatitis C Ab: NONREACTIVE
SIGNAL TO CUT-OFF: 0.02 (ref <1.00)

## 2019-02-17 LAB — TSH: TSH: 0.91 mIU/L

## 2019-02-20 LAB — CYTOLOGY - PAP
Chlamydia: NEGATIVE
Diagnosis: NEGATIVE
Neisseria Gonorrhea: NEGATIVE
Trichomonas: NEGATIVE

## 2019-05-01 ENCOUNTER — Ambulatory Visit: Payer: BC Managed Care – PPO | Attending: Internal Medicine

## 2019-05-01 ENCOUNTER — Other Ambulatory Visit: Payer: Self-pay

## 2019-05-01 DIAGNOSIS — Z20822 Contact with and (suspected) exposure to covid-19: Secondary | ICD-10-CM

## 2019-05-02 LAB — NOVEL CORONAVIRUS, NAA: SARS-CoV-2, NAA: NOT DETECTED

## 2019-05-13 ENCOUNTER — Ambulatory Visit: Payer: BC Managed Care – PPO | Attending: Internal Medicine

## 2019-05-13 DIAGNOSIS — Z20822 Contact with and (suspected) exposure to covid-19: Secondary | ICD-10-CM

## 2019-05-14 LAB — NOVEL CORONAVIRUS, NAA: SARS-CoV-2, NAA: NOT DETECTED

## 2019-05-18 ENCOUNTER — Ambulatory Visit: Payer: BC Managed Care – PPO | Attending: Internal Medicine

## 2019-05-18 DIAGNOSIS — Z20822 Contact with and (suspected) exposure to covid-19: Secondary | ICD-10-CM

## 2019-05-19 LAB — NOVEL CORONAVIRUS, NAA: SARS-CoV-2, NAA: NOT DETECTED

## 2019-07-12 ENCOUNTER — Other Ambulatory Visit: Payer: Self-pay | Admitting: Family Medicine

## 2019-07-12 DIAGNOSIS — E038 Other specified hypothyroidism: Secondary | ICD-10-CM

## 2019-08-04 ENCOUNTER — Other Ambulatory Visit: Payer: Self-pay | Admitting: Family Medicine

## 2019-08-04 DIAGNOSIS — E038 Other specified hypothyroidism: Secondary | ICD-10-CM

## 2019-09-07 ENCOUNTER — Ambulatory Visit: Payer: BC Managed Care – PPO | Admitting: Family Medicine

## 2019-09-08 ENCOUNTER — Other Ambulatory Visit: Payer: Self-pay

## 2019-09-08 ENCOUNTER — Ambulatory Visit: Payer: BC Managed Care – PPO | Admitting: Family Medicine

## 2019-09-08 ENCOUNTER — Encounter: Payer: Self-pay | Admitting: Family Medicine

## 2019-09-08 VITALS — BP 130/70 | HR 111 | Temp 97.5°F | Resp 16 | Ht 61.0 in | Wt 206.5 lb

## 2019-09-08 DIAGNOSIS — E669 Obesity, unspecified: Secondary | ICD-10-CM

## 2019-09-08 DIAGNOSIS — R0683 Snoring: Secondary | ICD-10-CM

## 2019-09-08 DIAGNOSIS — E038 Other specified hypothyroidism: Secondary | ICD-10-CM

## 2019-09-08 DIAGNOSIS — R7303 Prediabetes: Secondary | ICD-10-CM

## 2019-09-08 DIAGNOSIS — R718 Other abnormality of red blood cells: Secondary | ICD-10-CM

## 2019-09-08 NOTE — Progress Notes (Signed)
Name: Alexandra Crane   MRN: [id number]    DOB: June 11, 1995   Date:09/08/2019       Progress Note  Subjective  Chief Complaint  Chief Complaint  Patient presents with  . Hypothyroidism    HPI  Hypothyroidism: diagnosed in her 11-12 , she has been taking Synthroid 100 mcg daily and an extra hal pill on S, T and T, she is due for repeat labs. No change in bowel movements, no dry skin, mild hair loss but stable. She denies dysphagia   Obesity: she lost 4 lbs since last visit, trying to drink more water, and being more active.   Snoring/Elevated HCT: discussed ESS , possible referral to ENT but she would like to hold off for now.   Pre-diabetes: she is trying to avoid sweets, drinking more water, denies polyphagia, polydipsia or polyuria, we will check A1C yearly  Patient Active Problem List   Diagnosis Date Noted  . Allergic rhinitis 10/25/2016  . Snoring 08/08/2016  . Elevated hematocrit 08/08/2016  . 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 Prediabetes 07/09/2016  . Family planning 11/22/2014  . Flexural eczema 11/22/2014  . 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 Obesity (BMI 35.0-39.9 without comorbidity) 11/22/2014    History reviewed. No pertinent surgical history.  Family History  Problem Relation Age of Onset  . Hypertension Mother        borderline  . Hypothyroidism Mother   . Hypertension Father        borderline  . Hypothyroidism Sister   . Hypertension Maternal Grandmother   . Alzheimer's disease Maternal Grandmother   . Alzheimer's disease Maternal Grandfather     Social History   Tobacco Use  . Smoking status: Never Smoker  . Smokeless tobacco: Never Used  Substance Use Topics  . Alcohol use: No    Alcohol/week: 0.0 standard drinks     Current Outpatient Medications:  .  cetirizine (ZYRTEC) 10 MG tablet, Take 10 mg by mouth daily., Disp: , Rfl:  .  ibuprofen (ADVIL,MOTRIN) 200 MG tablet, Take 200 mg by mouth every 6 (six) hours as needed. Taking  200 mg -400 mg tablet as needed depending on pain, Disp: , Rfl:  .  ranitidine (ZANTAC) 150 MG tablet, Take 150 mg by mouth daily., Disp: , Rfl:  .  SYNTHROID 100 MCG tablet, TAKE 1 TABLETS ONCE DAILY MON/WED/FRI/SAT TAKE 1 AND 1/2 TABLETS ON SUN/TUES/THURS, Disp: 102 tablet, Rfl: 1 .  triamcinolone cream (KENALOG) 0.1 %, Apply 1 application topically 2 (two) times daily., Disp: 30 g, Rfl: 0  No Known Allergies  I personally reviewed active problem list, medication list, allergies, family history, social history, health maintenance with the patient/caregiver today.   ROS  Constitutional: Negative for fever or significant weight change.  Respiratory: Negative for cough and shortness of breath.   Cardiovascular: Negative for chest pain or palpitations.  Gastrointestinal: Negative for abdominal pain, no bowel changes.  Musculoskeletal: Negative for gait problem or joint swelling.  Skin: Negative for rash.  Neurological: Negative for dizziness or headache.  No other specific complaints in a complete review of systems (except as listed in HPI above).  Objective  Vitals:   09/08/19 0750  BP: 130/70  Pulse: (!) 111  Resp: 16  Temp: (!) 97.5 F (36.4 C)  TempSrc: Temporal  SpO2: 99%  Weight: 206 lb 8 oz (93.7 kg)  Height: 5\' 1"  (1.549 m)    Body mass index is 39.02 kg/m.  Physical Exam  Constitutional: Patient appears well-developed and  well-nourished. Obese No distress.  HEENT: head atraumatic, normocephalic, pupils equal and reactive to light, ears : normal TM, mild erythem on left ear cannal, neck supple, normal thyroid  Cardiovascular: Normal rate, regular rhythm and normal heart sounds.  No murmur heard. No BLE edema. Pulmonary/Chest: Effort normal and breath sounds normal. No respiratory distress. Abdominal: Soft.  There is no tenderness. Psychiatric: Patient has a normal mood and affect. behavior is normal. Judgment and thought content normal.   PHQ2/9: Depression  screen [facility] 2/9 09/08/2019 02/13/2019 12/09/2018 04/11/2018 03/14/2018  Decreased Interest 0 1 0 0 0  Down, Depressed, Hopeless 0 0 0 0 0  PHQ - 2 Score 0 1 0 0 0  Altered sleeping 0 1 0 0 0  Tired, decreased energy 0 0 0 0 0  Change in appetite 0 1 0 0 0  Feeling bad or failure about yourself  0 1 0 0 0  Trouble concentrating 0 0 0 0 0  Moving slowly or fidgety/restless 0 0 0 0 0  Suicidal thoughts 0 0 0 0 0  PHQ-9 Score 0 4 0 0 0  Difficult doing work/chores - Not difficult at all Not difficult at all Not difficult at all Not difficult at all    phq 9 is negative   Fall Risk: Fall Risk  09/08/2019 02/13/2019 12/09/2018 04/11/2018 03/14/2018  Falls in the past year? 0 0 0 0 0  Number falls in past yr: 0 0 0 - 0  Injury with Fall? 0 0 0 - -  Follow up - - Falls evaluation completed - -      Functional Status Survey: Is the patient deaf or have difficulty hearing?: No Does the patient have difficulty seeing, even when wearing glasses/contacts?: No Does the patient have difficulty concentrating, remembering, or making decisions?: No Does the patient have difficulty walking or climbing stairs?: No Does the patient have difficulty dressing or bathing?: No Does the patient have difficulty doing errands alone such as visiting a doctor's office or shopping?: No   Assessment & Plan  1. Hypothyroidism, juvenile  - TSH  2. Prediabetes  She is trying to avoid sweets, we will recheck labs during her cpe this fall   3. Elevated hematocrit  Last level was back to normal but with a history of snoring discussed sleep study , she had to leave in a hurry to go to work and we will re-assess on her next visit   4. Obesity (BMI 35.0-39.9 without comorbidity)  Discussed with the patient the risk posed by an increased BMI. Discussed importance of portion control, calorie counting and at least 150 minutes of physical activity weekly. Avoid sweet beverages and drink more water. Eat at least 6  servings of fruit and vegetables daily   5. Snoring  We will do ESS on her next visit

## 2019-09-09 ENCOUNTER — Other Ambulatory Visit: Payer: Self-pay | Admitting: Family Medicine

## 2019-09-09 DIAGNOSIS — E038 Other specified hypothyroidism: Secondary | ICD-10-CM

## 2019-09-09 LAB — TSH: TSH: 0.61 mIU/L

## 2019-12-28 ENCOUNTER — Other Ambulatory Visit: Payer: BC Managed Care – PPO

## 2019-12-29 ENCOUNTER — Other Ambulatory Visit: Payer: Self-pay

## 2019-12-29 DIAGNOSIS — E038 Other specified hypothyroidism: Secondary | ICD-10-CM

## 2019-12-30 ENCOUNTER — Other Ambulatory Visit: Payer: Self-pay | Admitting: Family Medicine

## 2019-12-30 DIAGNOSIS — E038 Other specified hypothyroidism: Secondary | ICD-10-CM

## 2019-12-30 MED ORDER — SYNTHROID 100 MCG PO TABS: ORAL_TABLET | ORAL | 0 refills | Status: DC

## 2019-12-30 NOTE — Telephone Encounter (Signed)
° °  Notes to clinic: review for refill Looks like provider had question about how patient takes medication     Requested Prescriptions  Pending Prescriptions Disp Refills   SYNTHROID 100 MCG tablet 102 tablet 1    Sig: TAKE 1 TABLETS ONCE DAILY MON/WED/FRI/SAT TAKE 1 AND 1/2 TABLETS ON SUN/TUES/THURS      Endocrinology:  Hypothyroid Agents Failed - 12/30/2019  1:35 PM      Failed - TSH needs to be rechecked within 3 months after an abnormal result. Refill until TSH is due.      Passed - TSH in normal range and within 360 days    TSH  Date Value Ref Range Status  09/08/2019 0.61 mIU/L Final    Comment:              Reference Range .           > or = 20 Years  0.40-4.50 .                Pregnancy Ranges           First trimester    0.26-2.66           Second trimester   0.55-2.73           Third trimester    0.43-2.91           Passed - Valid encounter within last 12 months    Recent Outpatient Visits           3 months ago Hypothyroidism, juvenile   [facility] Alba Cory, MD   10 months ago Well woman exam with routine gynecological exam   [facility] Doren Custard, FNP   1 year ago Hypothyroidism, juvenile   [facility] Doren Custard, FNP   1 year ago Viral upper respiratory tract infection   [facility] Alba Cory, MD   1 year ago Hypothyroidism, juvenile   [facility] Doren Custard, FNP       Future Appointments             In 2 months Alba Cory, MD [place] [facility], [facility]

## 2019-12-30 NOTE — Telephone Encounter (Signed)
SYNTHROID 100 MCG tablet    Patient requesting refill.    Pharmacy:  [id number] Dan [place]re[place], [place] - [address] Phone:  [phone]  Fax:  [phone]

## 2019-12-30 NOTE — Telephone Encounter (Signed)
Sunday, Tuesday and Thursday she takes 1.5tablet then Monday, Wednesday, Friday and Saturday she takes 1tablet

## 2019-12-31 ENCOUNTER — Other Ambulatory Visit: Payer: Self-pay

## 2019-12-31 DIAGNOSIS — E038 Other specified hypothyroidism: Secondary | ICD-10-CM

## 2020-01-03 MED ORDER — SYNTHROID 100 MCG PO TABS: ORAL_TABLET | ORAL | 0 refills | Status: DC

## 2020-03-10 ENCOUNTER — Other Ambulatory Visit: Payer: Self-pay

## 2020-03-10 ENCOUNTER — Other Ambulatory Visit: Payer: Self-pay | Admitting: Family Medicine

## 2020-03-10 ENCOUNTER — Other Ambulatory Visit (HOSPITAL_COMMUNITY)
Admission: RE | Admit: 2020-03-10 | Discharge: 2020-03-10 | Disposition: A | Discharge status: Home / Self Care | Payer: BC Managed Care – PPO | Source: Ambulatory Visit | Attending: Family Medicine | Admitting: Family Medicine

## 2020-03-10 ENCOUNTER — Encounter: Payer: Self-pay | Admitting: Family Medicine

## 2020-03-10 ENCOUNTER — Ambulatory Visit (INDEPENDENT_AMBULATORY_CARE_PROVIDER_SITE_OTHER): Payer: BC Managed Care – PPO | Admitting: Family Medicine

## 2020-03-10 VITALS — BP 140/72 | HR 91 | Temp 98.4°F | Resp 18 | Ht 61.0 in | Wt 198.8 lb

## 2020-03-10 DIAGNOSIS — R748 Abnormal levels of other serum enzymes: Secondary | ICD-10-CM | POA: Diagnosis not present

## 2020-03-10 DIAGNOSIS — E038 Other specified hypothyroidism: Secondary | ICD-10-CM

## 2020-03-10 DIAGNOSIS — R7303 Prediabetes: Secondary | ICD-10-CM | POA: Diagnosis not present

## 2020-03-10 DIAGNOSIS — Z113 Encounter for screening for infections with a predominantly sexual mode of transmission: Secondary | ICD-10-CM | POA: Diagnosis present

## 2020-03-10 DIAGNOSIS — Z0000 Encounter for general adult medical examination without abnormal findings: Secondary | ICD-10-CM

## 2020-03-10 DIAGNOSIS — E669 Obesity, unspecified: Secondary | ICD-10-CM

## 2020-03-10 DIAGNOSIS — Z23 Encounter for immunization: Secondary | ICD-10-CM

## 2020-03-10 DIAGNOSIS — Z30011 Encounter for initial prescription of contraceptive pills: Secondary | ICD-10-CM

## 2020-03-10 MED ORDER — DROSPIRENONE-ETHINYL ESTRADIOL 3-0.02 MG PO TABS: 1.0000 | ORAL_TABLET | Freq: Every day | ORAL | 11 refills | Status: DC

## 2020-03-10 NOTE — Patient Instructions (Addendum)
Think about HPV vaccine and return sooner if you would like   Preventive Care 22-22 Years Old, Female Preventive care refers to visits with your health care provider and lifestyle choices that can promote health and wellness. This includes:  A yearly physical exam. This may also be called an annual well check.  Regular dental visits and eye exams.  Immunizations.  Screening for certain conditions.  Healthy lifestyle choices, such as eating a healthy diet, getting regular exercise, not using drugs or products that contain nicotine and tobacco, and limiting alcohol use. What can I expect for my preventive care visit? Physical exam Your health care provider will check your:  Height and weight. This may be used to calculate body mass index (BMI), which tells if you are at a healthy weight.  Heart rate and blood pressure.  Skin for abnormal spots. Counseling Your health care provider may ask you questions about your:  Alcohol, tobacco, and drug use.  Emotional well-being.  Home and relationship well-being.  Sexual activity.  Eating habits.  Work and work Statistician.  Method of birth control.  Menstrual cycle.  Pregnancy history. What immunizations do I need?  Influenza (flu) vaccine  This is recommended every year. Tetanus, diphtheria, and pertussis (Tdap) vaccine  You may need a Td booster every 10 years. Varicella (chickenpox) vaccine  You may need this if you have not been vaccinated. Human papillomavirus (HPV) vaccine  If recommended by your health care provider, you may need three doses over 6 months. Measles, mumps, and rubella (MMR) vaccine  You may need at least one dose of MMR. You may also need a second dose. Meningococcal conjugate (MenACWY) vaccine  One dose is recommended if you are age 80-22 years and a first-year college student living in a residence hall, or if you have one of several medical conditions. You may also need additional booster  doses. Pneumococcal conjugate (PCV13) vaccine  You may need this if you have certain conditions and were not previously vaccinated. Pneumococcal polysaccharide (PPSV23) vaccine  You may need one or two doses if you smoke cigarettes or if you have certain conditions. Hepatitis A vaccine  You may need this if you have certain conditions or if you travel or work in places where you may be exposed to hepatitis A. Hepatitis B vaccine  You may need this if you have certain conditions or if you travel or work in places where you may be exposed to hepatitis B. Haemophilus influenzae type b (Hib) vaccine  You may need this if you have certain conditions. You may receive vaccines as individual doses or as more than one vaccine together in one shot (combination vaccines). Talk with your health care provider about the risks and benefits of combination vaccines. What tests do I need?  Blood tests  Lipid and cholesterol levels. These may be checked every 5 years starting at age 22.  Hepatitis C test.  Hepatitis B test. Screening  Diabetes screening. This is done by checking your blood sugar (glucose) after you have not eaten for a while (fasting).  Sexually transmitted disease (STD) testing.  BRCA-related cancer screening. This may be done if you have a family history of breast, ovarian, tubal, or peritoneal cancers.  Pelvic exam and Pap test. This may be done every 3 years starting at age 22. Starting at age 49, this may be done every 5 years if you have a Pap test in combination with an HPV test. Talk with your health care provider about  your test results, treatment options, and if necessary, the need for more tests. Follow these instructions at home: Eating and drinking   Eat a diet that includes fresh fruits and vegetables, whole grains, lean protein, and low-fat dairy.  Take vitamin and mineral supplements as recommended by your health care provider.  Do not drink alcohol  if: ? Your health care provider tells you not to drink. ? You are pregnant, may be pregnant, or are planning to become pregnant.  If you drink alcohol: ? Limit how much you have to 0-1 drink a day. ? Be aware of how much alcohol is in your drink. In the [place] one drink equals one 12 oz bottle of beer (355 mL), one 5 oz glass of wine (148 mL), or one 1 oz glass of hard liquor (44 mL). Lifestyle  Take daily care of your teeth and gums.  Stay active. Exercise for at least 30 minutes on 5 or more days each week.  Do not use any products that contain nicotine or tobacco, such as cigarettes, e-cigarettes, and chewing tobacco. If you need help quitting, ask your health care provider.  If you are sexually active, practice safe sex. Use a condom or other form of birth control (contraception) in order to prevent pregnancy and STIs (sexually transmitted infections). If you plan to become pregnant, see your health care provider for a preconception visit. What's next?  Visit your health care provider once a year for a well check visit.  Ask your health care provider how often you should have your eyes and teeth checked.  Stay up to date on all vaccines. This information is not intended to replace advice given to you by your health care provider. Make sure you discuss any questions you have with your health care provider. Document Revised: 01/09/2018 Document Reviewed: 01/09/2018 Elsevier Patient Education  2020 [employer].

## 2020-03-10 NOTE — Progress Notes (Signed)
Name: Alexandra Crane   MRN: [id number]    DOB: 05/28/1995   Date:03/10/2020       Progress Note  Subjective  Chief Complaint  CPE  HPI  Patient presents for annual CPE and follow up  Hypothyroidism: diagnosed in her 11-12 , she has been taking Synthroid 100 mcg daily and an extra hal pill on S, T and T, last TSH was at goal. No change in bowel movements, no dry skin, mild hair loss but stable. She denies dysphagia   Obesity: she lost 8 lbs since last visit, trying to drink more water, she states active at work  Pre-diabetes: she is trying to avoid sweets, drinking more water, denies polyphagia, polydipsia or polyuria, Last A1C was normal and we will recheck it today   Diet: she has been eating breakfast, lunch and dinner. She has been packing her food for work  Exercise: active at work , works as a Theatre manager with toddlers.     Office Visit from 02/13/2019 in [facility]  AUDIT-C Score 0     Depression: Phq 9 is  negative Depression screen [facility] 2/9 03/10/2020 09/08/2019 02/13/2019 12/09/2018 04/11/2018  Decreased Interest 0 0 1 0 0  Down, Depressed, Hopeless 0 0 0 0 0  PHQ - 2 Score 0 0 1 0 0  Altered sleeping 0 0 1 0 0  Tired, decreased energy 0 0 0 0 0  Change in appetite 0 0 1 0 0  Feeling bad or failure about yourself  0 0 1 0 0  Trouble concentrating 0 0 0 0 0  Moving slowly or fidgety/restless 0 0 0 0 0  Suicidal thoughts 0 0 0 0 0  PHQ-9 Score 0 0 4 0 0  Difficult doing work/chores Not difficult at all - Not difficult at all Not difficult at all Not difficult at all   Hypertension: BP Readings from Last 3 Encounters:  03/10/20 140/72  09/08/19 130/70  02/13/19 126/62   Obesity: Wt Readings from Last 3 Encounters:  03/10/20 198 lb 12.8 oz (90.2 kg)  09/08/19 206 lb 8 oz (93.7 kg)  02/13/19 210 lb 14.4 oz (95.7 kg)   BMI Readings from Last 3 Encounters:  03/10/20 37.56 kg/m  09/08/19 39.02 kg/m  02/13/19 39.85 kg/m     Vaccines:   HPV: up to at age 52 , ask insurance if age between 9-45  Flu: Given today, educated and discussed with patient.  Hep C Screening: 02/13/19 STD testing and prevention (HIV/chl/gon/syphilis): today  Intimate partner violence: negative screen  Sexual History : sexually active , same partner for the past 2 years, using condoms, discussed other forms of contraception, she states in the past she tried oral contraceptives and made her feel tired, but willing to try it again  Menstrual History/LMP/Abnormal Bleeding: her cycles are usually regular, occasionally skips a month, usually heavy Incontinence Symptoms: no problems   Breast cancer:  - BRCA gene screening: N/A  Osteoporosis: Discussed high calcium and vitamin D supplementation, weight bearing exercises  Cervical cancer screening: 02/13/19  Skin cancer: Discussed monitoring for atypical lesions  Colorectal cancer: N/A     Advanced Care Planning: A voluntary discussion about advance care planning including the explanation and discussion of advance directives.  Discussed health care proxy and Living will, and the patient was able to identify a health care proxy as mother   Patient does not have a living will at present time.  Lipids: Lab Results  Component Value Date   CHOL 140 12/09/2018   CHOL 160 03/14/2018   CHOL 156 07/09/2016   Lab Results  Component Value Date   HDL 49 (L) 12/09/2018   HDL 46 (L) 03/14/2018   HDL 43 (L) 07/09/2016   Lab Results  Component Value Date   LDLCALC 73 12/09/2018   LDLCALC 91 03/14/2018   LDLCALC 74 07/09/2016   Lab Results  Component Value Date   TRIG 101 12/09/2018   TRIG 124 03/14/2018   TRIG 195 (H) 07/09/2016   Lab Results  Component Value Date   CHOLHDL 2.9 12/09/2018   CHOLHDL 3.5 03/14/2018   CHOLHDL 3.6 07/09/2016   No results found for: LDLDIRECT  Glucose: Glucose  Date Value Ref Range Status  04/22/2013 107 (H) 65 - 99 mg/dL Final   Glucose, Bld  Date Value Ref  Range Status  12/09/2018 97 65 - 99 mg/dL Final    Comment:    .            Fasting reference interval .   03/14/2018 83 65 - 99 mg/dL Final    Comment:    .            Fasting reference interval .   08/07/2016 90 65 - 99 mg/dL Final    Patient Active Problem List   Diagnosis Date Noted  . Allergic rhinitis 10/25/2016  . Snoring 08/08/2016  . Elevated hematocrit 08/08/2016  . 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 Prediabetes 07/09/2016  . Family planning 11/22/2014  . Flexural eczema 11/22/2014  . 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 Obesity (BMI 35.0-39.9 without comorbidity) 11/22/2014    No past surgical history on file.  Family History  Problem Relation Age of Onset  . Hypertension Mother        borderline  . Hypothyroidism Mother   . Hypertension Father        borderline  . Hypothyroidism Sister   . Hypertension Maternal Grandmother   . Alzheimer's disease Maternal Grandmother   . Alzheimer's disease Maternal Grandfather     Social History   Socioeconomic History  . Marital status: Single    Spouse name: Not on file  . Number of children: Not on file  . Years of education: Not on file  . Highest education level: Not on file  Occupational History  . Not on file  Tobacco Use  . Smoking status: Never Smoker  . Smokeless tobacco: Never Used  Vaping Use  . Vaping Use: Never used  Substance and Sexual Activity  . Alcohol use: No    Alcohol/week: 0.0 standard drinks  . Drug use: No  . Sexual activity: Never  Other Topics Concern  . Not on file  Social History Narrative  . Not on file   Social Determinants of Health   Financial Resource Strain: Low Risk   . Difficulty of Paying Living Expenses: Not hard at all  Food Insecurity: No Food Insecurity  . Worried About Charity fundraiser in the Last Year: Never true  . Ran Out of Food in the Last Year: Never true  Transportation Needs: No Transportation Needs  . Lack of Transportation  (Medical): No  . Lack of Transportation (Non-Medical): No  Physical Activity: Inactive  . Days of Exercise per Week: 0 days  . Minutes of Exercise per Session: 0 min  Stress: No Stress Concern Present  . Feeling of Stress : Not at all  Social Connections: Moderately Isolated  . Frequency of Communication  with Friends and Family: More than three times a week  . Frequency of Social Gatherings with Friends and Family: More than three times a week  . Attends Religious Services: More than 4 times per year  . Active Member of Clubs or Organizations: No  . Attends Archivist Meetings: Never  . Marital Status: Never married  Intimate Partner Violence: Not At Risk  . Fear of Current or Ex-Partner: No  . Emotionally Abused: No  . Physically Abused: No  . Sexually Abused: No     Current Outpatient Medications:  .  cetirizine (ZYRTEC) 10 MG tablet, Take 10 mg by mouth daily., Disp: , Rfl:  .  ibuprofen (ADVIL,MOTRIN) 200 MG tablet, Take 200 mg by mouth every 6 (six) hours as needed. Taking 200 mg -400 mg tablet as needed depending on pain, Disp: , Rfl:  .  ranitidine (ZANTAC) 150 MG tablet, Take 150 mg by mouth daily., Disp: , Rfl:  .  SYNTHROID 100 MCG tablet, TAKE 1 TABLETS ONCE DAILY MON/WED/FRI/SAT TAKE 1 AND 1/2 TABLETS ON SUN/TUES/THURS, Disp: 102 tablet, Rfl: 0 .  triamcinolone cream (KENALOG) 0.1 %, Apply 1 application topically 2 (two) times daily., Disp: 30 g, Rfl: 0  No Known Allergies   ROS  Constitutional: Negative for fever, positive for  weight change.  Respiratory: Negative for cough and shortness of breath.   Cardiovascular: Negative for chest pain or palpitations.  Gastrointestinal: Negative for abdominal pain, no bowel changes.  Musculoskeletal: Negative for gait problem or joint swelling.  Skin: Negative for rash.  Neurological: Negative for dizziness or headache.  No other specific complaints in a complete review of systems (except as listed in HPI  above).  Objective  Vitals:   03/10/20 0856  BP: 140/72  Pulse: 91  Resp: 18  Temp: 98.4 F (36.9 C)  TempSrc: Oral  SpO2: 99%  Weight: 198 lb 12.8 oz (90.2 kg)  Height: '5\' 1"'  (1.549 m)    Body mass index is 37.56 kg/m.  Physical Exam  Constitutional: Patient appears well-developed and well-nourished. No distress.  HENT: Head: Normocephalic and atraumatic. Ears: B TMs ok, no erythema or effusion; Nose: Nose normal. Mouth/Throat: Oropharynx is clear and moist. No oropharyngeal exudate.  Eyes: Conjunctivae and EOM are normal. Pupils are equal, round, and reactive to light. No scleral icterus.  Neck: Normal range of motion. Neck supple. No JVD present. No thyromegaly present.  Cardiovascular: Normal rate, regular rhythm and normal heart sounds.  No murmur heard. No BLE edema. Pulmonary/Chest: Effort normal and breath sounds normal. No respiratory distress. Abdominal: Soft. Bowel sounds are normal, no distension. There is no tenderness. no masses Breast: no lumps or masses, no nipple discharge or rashes FEMALE GENITALIA:  On her cycle, not done  RECTAL: not done  Musculoskeletal: Normal range of motion, no joint effusions. No gross deformities Neurological: he is alert and oriented to person, place, and time. No cranial nerve deficit. Coordination, balance, strength, speech and gait are normal.  Skin: Skin is warm and dry. No rash noted. No erythema.  Psychiatric: Patient has a normal mood and affect. behavior is normal. Judgment and thought content normal.  Fall Risk: Fall Risk  03/10/2020 09/08/2019 02/13/2019 12/09/2018 04/11/2018  Falls in the past year? 0 0 0 0 0  Number falls in past yr: 0 0 0 0 -  Injury with Fall? 1 0 0 0 -  Follow up - - - Falls evaluation completed -     Functional Status Survey:  Is the patient deaf or have difficulty hearing?: No Does the patient have difficulty seeing, even when wearing glasses/contacts?: No Does the patient have difficulty  concentrating, remembering, or making decisions?: No Does the patient have difficulty walking or climbing stairs?: No Does the patient have difficulty dressing or bathing?: No Does the patient have difficulty doing errands alone such as visiting a doctor's office or shopping?: No   Assessment & Plan  1. Well adult exam  - CBC with Differential - COMPLETE METABOLIC PANEL WITH GFR - Lipid Profile - HgB A1c - TSH  2. Prediabetes   3. Low HDL    4. Obesity (BMI 35.0-39.9 without comorbidity)  Discussed with the patient the risk posed by an increased BMI. Discussed importance of portion control, calorie counting and at least 150 minutes of physical activity weekly. Avoid sweet beverages and drink more water. Eat at least 6 servings of fruit and vegetables daily   5. Routine screening for STI (sexually transmitted infection)  - Cervicovaginal ancillary only - RPR - HIV Antibody (routine testing w rflx)  6. Needs flu shot  Today   7. Need for HPV vaccination  She wants to think about it  8. Encounter for initial prescription of contraceptive pills  - drospirenone-ethinyl estradiol (YAZ) 3-0.02 MG tablet; Take 1 tablet by mouth daily.  Dispense: 28 tablet; Refill: 11  -USPSTF grade A and B recommendations reviewed with patient; age-appropriate recommendations, preventive care, screening tests, etc discussed and encouraged; healthy living encouraged; see AVS for patient education given to patient -Discussed importance of 150 minutes of physical activity weekly, eat two servings of fish weekly, eat one serving of tree nuts ( cashews, pistachios, pecans, almonds.Marland Kitchen) every other day, eat 6 servings of fruit/vegetables daily and drink plenty of water and avoid sweet beverages.

## 2020-03-11 LAB — COMPLETE METABOLIC PANEL WITH GFR
AG Ratio: 1.5 (calc) (ref 1.0–2.5)
ALT: 26 U/L (ref 6–29)
AST: 21 U/L (ref 10–30)
Albumin: 4.2 g/dL (ref 3.6–5.1)
Alkaline phosphatase (APISO): 81 U/L (ref 31–125)
BUN: 11 mg/dL (ref 7–25)
CO2: 25 mmol/L (ref 20–32)
Calcium: 9.2 mg/dL (ref 8.6–10.2)
Chloride: 103 mmol/L (ref 98–110)
Creat: 0.72 mg/dL (ref 0.50–1.10)
GFR, Est African American: 138 mL/min/{1.73_m2} (ref >=60)
GFR, Est Non African American: 119 mL/min/{1.73_m2} (ref >=60)
Globulin: 2.8 g/dL (calc) (ref 1.9–3.7)
Glucose, Bld: 119 mg/dL — ABNORMAL HIGH (ref 65–99)
Potassium: 4.2 mmol/L (ref 3.5–5.3)
Sodium: 136 mmol/L (ref 135–146)
Total Bilirubin: 0.3 mg/dL (ref 0.2–1.2)
Total Protein: 7 g/dL (ref 6.1–8.1)

## 2020-03-11 LAB — LIPID PANEL
Cholesterol: 160 mg/dL (ref <200)
HDL: 44 mg/dL — ABNORMAL LOW (ref >=50)
LDL Cholesterol (Calc): 91 mg/dL (calc)
Non-HDL Cholesterol (Calc): 116 mg/dL (calc) (ref <130)
Total CHOL/HDL Ratio: 3.6 (calc) (ref <5.0)
Triglycerides: 145 mg/dL (ref <150)

## 2020-03-11 LAB — CBC WITH DIFFERENTIAL/PLATELET
Absolute Monocytes: 333 cells/uL (ref 200–950)
Basophils Absolute: 32 cells/uL (ref 0–200)
Basophils Relative: 0.5 %
Eosinophils Absolute: 58 cells/uL (ref 15–500)
Eosinophils Relative: 0.9 %
HCT: 41.7 % (ref 35.0–45.0)
Hemoglobin: 13.8 g/dL (ref 11.7–15.5)
Lymphs Abs: 1235 cells/uL (ref 850–3900)
MCH: 27.1 pg (ref 27.0–33.0)
MCHC: 33.1 g/dL (ref 32.0–36.0)
MCV: 81.9 fL (ref 80.0–100.0)
MPV: 9.4 fL (ref 7.5–12.5)
Monocytes Relative: 5.2 %
Neutro Abs: 4742 cells/uL (ref 1500–7800)
Neutrophils Relative %: 74.1 %
Platelets: 386 10*3/uL (ref 140–400)
RBC: 5.09 10*6/uL (ref 3.80–5.10)
RDW: 12 % (ref 11.0–15.0)
Total Lymphocyte: 19.3 %
WBC: 6.4 10*3/uL (ref 3.8–10.8)

## 2020-03-11 LAB — HEMOGLOBIN A1C
Hgb A1c MFr Bld: 5.6 % of total Hgb (ref <5.7)
Mean Plasma Glucose: 114 (calc)
eAG (mmol/L): 6.3 (calc)

## 2020-03-11 LAB — CERVICOVAGINAL ANCILLARY ONLY
Chlamydia: NEGATIVE
Comment: NEGATIVE
Comment: NORMAL
Neisseria Gonorrhea: NEGATIVE

## 2020-03-11 LAB — RPR: RPR Ser Ql: NONREACTIVE

## 2020-03-11 LAB — TSH: TSH: 0.78 mIU/L

## 2020-03-11 LAB — HIV ANTIBODY (ROUTINE TESTING W REFLEX): HIV 1&2 Ab, 4th Generation: NONREACTIVE

## 2020-03-11 MED ORDER — SYNTHROID 100 MCG PO TABS: ORAL_TABLET | ORAL | 1 refills | Status: DC

## 2020-05-20 ENCOUNTER — Ambulatory Visit (INDEPENDENT_AMBULATORY_CARE_PROVIDER_SITE_OTHER): Payer: BC Managed Care – PPO | Admitting: Internal Medicine

## 2020-05-20 DIAGNOSIS — B349 Viral infection, unspecified: Secondary | ICD-10-CM | POA: Diagnosis not present

## 2020-05-20 DIAGNOSIS — R519 Headache, unspecified: Secondary | ICD-10-CM

## 2020-05-20 NOTE — Progress Notes (Signed)
Name: Alexandra Crane   MRN: [id number]    DOB: [birth year]   Date:05/20/2020       Progress Note  Subjective  Chief Complaint  Chief Complaint  Patient presents with  . Emesis  . Diarrhea  . Fatigue  . Headache  . Nausea    I connected with  Alexandra Crane on 05/20/20 at 11:20 AM EST by telephone and verified that I am speaking with the correct person using two identifiers.  I discussed the limitations, risks, security and privacy concerns of performing an evaluation and management service by telephone and the availability of in person appointments. The patient expressed understanding and agreed to proceed. Staff also discussed with the patient that there may be a patient responsible charge related to this service. Patient Location: Home Provider Location: [facility] Additional Individuals present: none  HPI Patient is a 25 year old female patient of Dr. Ancil Boozer Last visit with her was 03/10/2020 Follows up today with the above complaints Mom sitting beside her and did interrupt on several occasions to interject and provide further input  Covid vaccination status - had vaccine X 2 Symptoms started Wed night, worse part now is HA A little cough, no production No marked SOB No fever, T- 97.5, some chills/sweats No sore throat.  + HA - 8-9/10, in front mostly, denied any migraine headache or chronic headache history No mild congestion, PND No  loss of smell, loss of taste + N/V, last vomiting Wed 11 pm, several times, no blood, mom noted this was the worst vomiting she has ever had + fatigue, weak  No marked muscle aches, just feels weak + loose stools/diarrhea - not have BM yet today, 3-4 times yesterday, no blood No CP,  passing out episodes  Trying to stay hydrated, tylenol, zofran tried and mom noted it did not help Comorbid conditions reviewed + obesity No DM  No recent abx, no recent travel  Works in daycare, 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   Mom was told when she made this appointment that the doctor would write her in order to get a Covid test and she could come to [facility] and get the test done today.  I did review how the Covid testing is obtained, and she was very upset that she was told the above and now she has to "start from [employer] "and getting her a test. Today the patient notes she feels a little better than Wed.  Patient Active Problem List   Diagnosis Date Noted  . Allergic rhinitis 10/25/2016  . Snoring 08/08/2016  . Elevated hematocrit 08/08/2016  . 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 Prediabetes 07/09/2016  . Family planning 11/22/2014  . Flexural eczema 11/22/2014  . 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 Obesity (BMI 35.0-39.9 without comorbidity) 11/22/2014    No past surgical history on file.  Family History  Problem Relation Age of Onset  . Hypertension Mother        borderline  . Hypothyroidism Mother   . Hypertension Father        borderline  . Hypothyroidism Sister   . Hypertension Maternal Grandmother   . Alzheimer's disease Maternal Grandmother   . Alzheimer's disease Maternal Grandfather     Social History   Tobacco Use  . Smoking status: Never Smoker  . Smokeless tobacco: Never Used  Substance Use Topics  . Alcohol use: No    Alcohol/week: 0.0 standard drinks     Current Outpatient Medications:  .  cetirizine (ZYRTEC) 10 MG tablet, Take 10 mg by mouth daily., Disp: , Rfl:  .  drospirenone-ethinyl estradiol (YAZ) 3-0.02 MG tablet, Take 1 tablet by mouth daily., Disp: 28 tablet, Rfl: 11 .  ibuprofen (ADVIL,MOTRIN) 200 MG tablet, Take 200 mg by mouth every 6 (six) hours as needed. Taking 200 mg -400 mg tablet as needed depending on pain, Disp: , Rfl:  .  ranitidine (ZANTAC) 150 MG tablet, Take 150 mg by mouth daily., Disp: , Rfl:  .  SYNTHROID 100 MCG tablet, TAKE 1 TABLETS ONCE DAILY MON/WED/FRI/SAT TAKE 1 AND 1/2 TABLETS ON SUN/TUES/THURS, Disp: 102 tablet, Rfl:  1 .  triamcinolone cream (KENALOG) 0.1 %, Apply 1 application topically 2 (two) times daily., Disp: 30 g, Rfl: 0  No Known Allergies  With staff assistance, above reviewed with the patient today.  ROS: As per HPI, otherwise no specific complaints on a limited and focused system review   Objective  Virtual encounter, vitals not obtained.  There is no height or weight on file to calculate BMI.  Physical Exam   Appears in NAD via conversation, pleasant Pulmonary/Chest: No obvious respiratory distress. Speaking in complete sentences Neurological: Pt is alert, Speech is normal Psychiatric: Patient has a normal mood and affect, behavior is normal. Judgment and thought content normal.   No results found for this or any previous visit (from the past 72 hour(s)).  PHQ2/9: Depression screen [facility] 2/9 05/20/2020 03/10/2020 09/08/2019 02/13/2019 12/09/2018  Decreased Interest 0 0 0 1 0  Down, Depressed, Hopeless 0 0 0 0 0  PHQ - 2 Score 0 0 0 1 0  Altered sleeping - 0 0 1 0  Tired, decreased energy - 0 0 0 0  Change in appetite - 0 0 1 0  Feeling bad or failure about yourself  - 0 0 1 0  Trouble concentrating - 0 0 0 0  Moving slowly or fidgety/restless - 0 0 0 0  Suicidal thoughts - 0 0 0 0  PHQ-9 Score - 0 0 4 0  Difficult doing work/chores - Not difficult at all - Not difficult at all Not difficult at all   PHQ-2/9 Result reviewed  Fall Risk: Fall Risk  05/20/2020 03/10/2020 09/08/2019 02/13/2019 12/09/2018  Falls in the past year? 0 0 0 0 0  Number falls in past yr: 0 0 0 0 0  Injury with Fall? 0 1 0 0 0  Follow up - - - - Falls evaluation completed     Assessment & Plan  1. Viral syndrome 2. Acute nonintractable headache, unspecified headache type Discussed with patient she likely has a viral syndrome with her constellation of symptoms, possibly a viral gastroenteritis noting the nausea and vomiting and loose stools/diarrhea component.  The most pressing symptom presently is  headaches.  Feels weak.  She did note she feels a little better today, has not yet had a bowel movement and  has had no further vomiting since Wednesday. Noted with the Covid pandemic ongoing, Covid is in the differential, and do feel getting tested for that is appropriate.  Her work wanted her to get tested before she returned. Recommended getting tested, and probably best to do so over the weekend as often in the first day or 2 of symptoms, the test has a higher chance of being a false negative.  Reviewed the options for testing including signing up online for the [facility] health system testing, or can get tested at a local pharmacy like [facility] or [facility].  Did note there testing outside of our office now as well through the [facility] system, and that is an option.  I did talk to [place],  our administrator about this information provided on the phone to her earlier, and she noted she would call her to talk to her to help her with the options for testing presently.  Also recommended the following: Emphasized the importance of staying well-hydrated, and can use Gatorade or Pedialyte products in addition to water to help, Also staying very bland with her diet as she slowly tries to increase with a BRAT diet approach mentioned as 1 that is reasonable to follow.  Can use Tylenol products as needed for headaches, and would not use Advil or Aleve type products presently as can bother the stomach. Recommended a Pepto-Bismol type product as needed to help with any nausea or diarrhea symptoms. F/u if sx's not improving or more problematic  I discussed the assessment and treatment plan with the patient. The patient was provided an opportunity to ask questions and all were answered. The patient agreed with the plan and demonstrated an understanding of the instructions.   The patient was advised to call back or seek an in-person evaluation if the symptoms worsen or if the condition fails to improve as anticipated.  I  provided 25 minutes of non-face-to-face time during this encounter that included discussing at length patient's sx/history, pertinent pmhx, medications, treatment and follow up plan. This time also included the necessary documentation, orders, and chart review.  Jamelle Haring, MD

## 2020-05-21 ENCOUNTER — Other Ambulatory Visit: Payer: Self-pay

## 2020-05-21 DIAGNOSIS — Z20822 Contact with and (suspected) exposure to covid-19: Secondary | ICD-10-CM

## 2020-05-23 LAB — SARS-COV-2, NAA 2 DAY TAT

## 2020-05-23 LAB — NOVEL CORONAVIRUS, NAA: SARS-CoV-2, NAA: NOT DETECTED

## 2020-09-09 ENCOUNTER — Ambulatory Visit: Payer: BC Managed Care – PPO | Admitting: Family Medicine

## 2020-09-09 ENCOUNTER — Other Ambulatory Visit: Payer: Self-pay

## 2020-09-09 ENCOUNTER — Encounter: Payer: Self-pay | Admitting: Family Medicine

## 2020-09-09 VITALS — BP 136/82 | HR 93 | Temp 98.2°F | Resp 16 | Ht 61.0 in | Wt 204.0 lb

## 2020-09-09 DIAGNOSIS — Z309 Encounter for contraceptive management, unspecified: Secondary | ICD-10-CM

## 2020-09-09 DIAGNOSIS — R748 Abnormal levels of other serum enzymes: Secondary | ICD-10-CM

## 2020-09-09 DIAGNOSIS — R7303 Prediabetes: Secondary | ICD-10-CM | POA: Diagnosis not present

## 2020-09-09 DIAGNOSIS — E8881 Metabolic syndrome: Secondary | ICD-10-CM

## 2020-09-09 DIAGNOSIS — R030 Elevated blood-pressure reading, without diagnosis of hypertension: Secondary | ICD-10-CM

## 2020-09-09 DIAGNOSIS — L2082 Flexural eczema: Secondary | ICD-10-CM

## 2020-09-09 DIAGNOSIS — E038 Other specified hypothyroidism: Secondary | ICD-10-CM

## 2020-09-09 MED ORDER — SYNTHROID 100 MCG PO TABS: ORAL_TABLET | ORAL | 1 refills | Status: DC

## 2020-09-09 NOTE — Progress Notes (Signed)
Name: Alexandra Crane   MRN: [id number]    DOB: Apr 15, 1996   Date:09/09/2020       Progress Note  Subjective  Chief Complaint  Follow Up  HPI  Hypothyroidism: diagnosed in her pre-teen years, she has been taking Synthroid 100 mcg daily and an extra hal pill on S, T and T, last TSH was at goal. No change in bowel movements, no dry skin, mild hair loss but stable. She denies dysphagia , weight has gone up some but she would like to hold off on rechecking level until visit   Elevated BP: discussed cardiovascular activity   Obesity: she has gained weight since last visit , drinking more water   Metabolic Syndrome: increase in abdominal girth, oligomenorrhea, insulin resistance and dyslipidemia, discussed metformin, physical activity and diet   Pre-diabetes: she is trying to avoid sweets, drinking more water, denies polyphagia, polydipsia or polyuria, Last A1C was normal. Discussed also importance of cutting down on carbohydrate intake   Contraception: she stopped taking Yaz because it caused cramping, she only took for one month, discussed IUD, she is willing to discuss it with gyn . She is sexually active , has oligomenorrhea   Patient Active Problem List   Diagnosis Date Noted  . Allergic rhinitis 10/25/2016  . Snoring 08/08/2016  . Elevated hematocrit 08/08/2016  . 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 Prediabetes 07/09/2016  . Family planning 11/22/2014  . Flexural eczema 11/22/2014  . 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 Obesity (BMI 35.0-39.9 without comorbidity) 11/22/2014    No past surgical history on file.  Family History  Problem Relation Age of Onset  . Hypertension Mother        borderline  . Hypothyroidism Mother   . Hypertension Father        borderline  . Hypothyroidism Sister   . Hypertension Maternal Grandmother   . Alzheimer's disease Maternal Grandmother   . Alzheimer's disease Maternal Grandfather     Social History   Tobacco Use  . Smoking  status: Never Smoker  . Smokeless tobacco: Never Used  Substance Use Topics  . Alcohol use: No    Alcohol/week: 0.0 standard drinks     Current Outpatient Medications:  .  ibuprofen (ADVIL,MOTRIN) 200 MG tablet, Take 200 mg by mouth every 6 (six) hours as needed. Taking 200 mg -400 mg tablet as needed depending on pain, Disp: , Rfl:  .  triamcinolone cream (KENALOG) 0.1 %, Apply 1 application topically 2 (two) times daily., Disp: 30 g, Rfl: 0 .  cetirizine (ZYRTEC) 10 MG tablet, Take 10 mg by mouth daily. (Patient not taking: Reported on 09/09/2020), Disp: , Rfl:  .  SYNTHROID 100 MCG tablet, TAKE 1 TABLETS ONCE DAILY MON/WED/FRI/SAT TAKE 1 AND 1/2 TABLETS ON SUN/TUES/THURS, Disp: 102 tablet, Rfl: 1  No Known Allergies  I personally reviewed active problem list, medication list, allergies, family history, social history, health maintenance with the patient/caregiver today.   ROS  Constitutional: Negative for fever, positive for  weight change.  Respiratory: Negative for cough and shortness of breath.   Cardiovascular: Negative for chest pain or palpitations.  Gastrointestinal: Negative for abdominal pain, no bowel changes.  Musculoskeletal: Negative for gait problem or joint swelling.  Skin: Negative for rash.  Neurological: Negative for dizziness or headache.  No other specific complaints in a complete review of systems (except as listed in HPI above).  Objective  Vitals:   09/09/20 0755  BP: 136/82  Pulse: 93  Resp: 16  Temp: 98.2  F (36.8 C)  TempSrc: Oral  SpO2: 99%  Weight: 204 lb (92.5 kg)  Height: 5\' 1"  (1.549 m)    Body mass index is 38.55 kg/m.  Physical Exam  Constitutional: Patient appears well-developed and well-nourished. Obese  No distress.  HEENT: head atraumatic, normocephalic, pupils equal and reactive to light, , neck supple Cardiovascular: Normal rate, regular rhythm and normal heart sounds.  No murmur heard. No BLE edema. Pulmonary/Chest: Effort  normal and breath sounds normal. No respiratory distress. Abdominal: Soft.  There is no tenderness. Psychiatric: Patient has a normal mood and affect. behavior is normal. Judgment and thought content normal.  PHQ2/9: Depression screen [facility] 2/9 09/09/2020 05/20/2020 03/10/2020 09/08/2019 02/13/2019  Decreased Interest 0 0 0 0 1  Down, Depressed, Hopeless 0 0 0 0 0  PHQ - 2 Score 0 0 0 0 1  Altered sleeping - - 0 0 1  Tired, decreased energy - - 0 0 0  Change in appetite - - 0 0 1  Feeling bad or failure about yourself  - - 0 0 1  Trouble concentrating - - 0 0 0  Moving slowly or fidgety/restless - - 0 0 0  Suicidal thoughts - - 0 0 0  PHQ-9 Score - - 0 0 4  Difficult doing work/chores - - Not difficult at all - Not difficult at all  Some recent data might be hidden    phq 9 is negative  Fall Risk: Fall Risk  09/09/2020 05/20/2020 03/10/2020 09/08/2019 02/13/2019  Falls in the past year? 0 0 0 0 0  Number falls in past yr: 0 0 0 0 0  Injury with Fall? 0 0 1 0 0  Follow up - - - - -     Functional Status Survey: Is the patient deaf or have difficulty hearing?: No Does the patient have difficulty seeing, even when wearing glasses/contacts?: No Does the patient have difficulty concentrating, remembering, or making decisions?: No Does the patient have difficulty walking or climbing stairs?: No Does the patient have difficulty dressing or bathing?: No Does the patient have difficulty doing errands alone such as visiting a doctor's office or shopping?: No    Assessment & Plan  1. Hypothyroidism, juvenile  - SYNTHROID 100 MCG tablet; TAKE 1 TABLETS ONCE DAILY MON/WED/FRI/SAT TAKE 1 AND 1/2 TABLETS ON SUN/TUES/THURS  Dispense: 102 tablet; Refill: 1  2. Prediabetes   3. Flexural eczema  Doing well at this time  4. Low serum HDL  Increase fish and tree nuts intake and exercise more  5. Elevated blood pressure reading  Discussed cardiovascular activity   6. Metabolic  syndrome   7. Encounter for contraceptive management, unspecified type  - Ambulatory referral to Obstetrics / Gynecology

## 2020-09-19 ENCOUNTER — Encounter: Payer: Commercial Managed Care - PPO | Admitting: Obstetrics and Gynecology

## 2021-04-26 ENCOUNTER — Ambulatory Visit
Admission: EM | Admit: 2021-04-26 | Discharge: 2021-04-26 | Disposition: A | Discharge status: Home / Self Care | Payer: BC Managed Care – PPO | Attending: Medical Oncology | Admitting: Medical Oncology

## 2021-04-26 ENCOUNTER — Encounter: Payer: Self-pay | Admitting: Emergency Medicine

## 2021-04-26 DIAGNOSIS — R0981 Nasal congestion: Secondary | ICD-10-CM | POA: Diagnosis not present

## 2021-04-26 DIAGNOSIS — R112 Nausea with vomiting, unspecified: Secondary | ICD-10-CM | POA: Diagnosis not present

## 2021-04-26 DIAGNOSIS — R051 Acute cough: Secondary | ICD-10-CM | POA: Diagnosis not present

## 2021-04-26 LAB — POCT INFLUENZA A/B
Influenza A, POC: NEGATIVE
Influenza B, POC: NEGATIVE

## 2021-04-26 MED ORDER — FLUTICASONE PROPIONATE 50 MCG/ACT NA SUSP: 2.0000 | Freq: Every day | NASAL | 0 refills | Status: DC

## 2021-04-26 MED ORDER — BENZONATATE 100 MG PO CAPS: 100.0000 mg | ORAL_CAPSULE | Freq: Three times a day (TID) | ORAL | 0 refills | Status: DC

## 2021-04-26 MED ORDER — ONDANSETRON 4 MG PO TBDP
4.0000 mg | ORAL_TABLET | Freq: Once | ORAL | Status: AC
  Administered 2021-04-26: 13:00:00 4 mg via ORAL

## 2021-04-26 NOTE — ED Triage Notes (Signed)
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 No fevers.

## 2021-04-26 NOTE — ED Provider Notes (Signed)
Renaldo Fiddler    CSN: [id number] Arrival date & time: 04/26/21  1136      History   Chief Complaint Chief Complaint  Patient presents with   Cough   Sore Throat   Emesis    HPI Alexandra Crane is a 25 y.o. female.   HPI  Cold Symptoms: Patient reports that they have had symptoms of cough, sore throat and vomiting for the past 2 days. Symptoms are stable. They deny SOB, chest pain, fever. She does report 1 episode of non-bloody vomiting along with continued nausea. They have tried tylenol and delsym for symptoms. 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 LMP: 3-4 weeks ago but denies any sexual activity within 2 months and denies any risk of pregnancy.   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 No pertinent surgical history.  OB History   No obstetric history on file.      Home Medications    Prior to Admission medications   Medication Sig Start Date End Date Taking? Authorizing Provider  cetirizine (ZYRTEC) 10 MG tablet Take 10 mg by mouth daily. Patient not taking: Reported on 09/09/2020    [provider]  ibuprofen (ADVIL,MOTRIN) 200 MG tablet Take 200 mg by mouth every 6 (six) hours as needed. Taking 200 mg -400 mg tablet as needed depending on pain    [provider]  SYNTHROID 100 MCG tablet TAKE 1 TABLETS ONCE DAILY MON/WED/FRI/SAT TAKE  1 AND 1/2 TABLETS ON SUN/TUES/THURS 09/09/20   Alba Cory, MD  triamcinolone cream (KENALOG) 0.1 % Apply 1 application topically 2 (two) times daily. 03/14/18   Doren Custard, FNP    Family History Family History  Problem Relation Age of Onset   Hypertension Mother        borderline   Hypothyroidism Mother    Hypertension Father        borderline   Hypothyroidism Sister    Hypertension Maternal Grandmother    Alzheimer's disease Maternal Grandmother    Alzheimer's disease Maternal Grandfather     Social History Social History   Tobacco Use   Smoking status: Never   Smokeless tobacco: Never  Vaping Use   Vaping Use: Never used  Substance Use Topics   Alcohol use: No    Alcohol/week: 0.0 standard drinks   Drug use: No     Allergies   Patient has no known allergies.   Review of Systems Review of Systems  As stated above in HPI Physical Exam Triage Vital Signs ED Triage Vitals  Enc Vitals Group     BP 04/26/21 1235 (!) 154/93     Pulse Rate 04/26/21 1235 (!) 111     Resp 04/26/21 1235 18     Temp 04/26/21 1235 98.2 F (36.8 C)     Temp Source 04/26/21 1235 Oral     SpO2 04/26/21 1235  97 %     Weight --      Height --      Head Circumference --      Peak Flow --      Pain Score 04/26/21 1238 5     Pain Loc --      Pain Edu? --      Excl. in [place]? --    No data found.  Updated Vital Signs BP (!) 154/93 (BP Location: Left Wrist)    Pulse (!) 111    Temp 98.2 F (36.8 C) (Oral)    Resp 18    SpO2 97%   Physical Exam Vitals and nursing note reviewed.  Constitutional:      General: She is not in acute distress.    Appearance: She is well-developed. She is not ill-appearing, toxic-appearing or diaphoretic.  HENT:     Head: Normocephalic and atraumatic.     Right Ear: Tympanic membrane normal. No middle ear effusion. Tympanic membrane is not erythematous.     Left Ear: Tympanic membrane normal.  No middle ear effusion. Tympanic membrane is not  erythematous.     Nose: Congestion present. No rhinorrhea.     Mouth/Throat:     Mouth: Mucous membranes are moist.     Pharynx: Oropharynx is clear. Uvula midline. No oropharyngeal exudate or posterior oropharyngeal erythema.     Tonsils: No tonsillar exudate or tonsillar abscesses.  Eyes:     Conjunctiva/sclera: Conjunctivae normal.     Pupils: Pupils are equal, round, and reactive to light.  Cardiovascular:     Rate and Rhythm: Normal rate and regular rhythm.     Heart sounds: Normal heart sounds.  Pulmonary:     Effort: Pulmonary effort is normal.     Breath sounds: Normal breath sounds.  Abdominal:     General: Bowel sounds are normal.     Palpations: Abdomen is soft.     Tenderness: There is no abdominal tenderness. There is no guarding.  Musculoskeletal:     Cervical back: Neck supple.  Lymphadenopathy:     Cervical: No cervical adenopathy.  Skin:    General: Skin is warm.  Neurological:     Mental Status: She is alert.     UC Treatments / Results  Labs (all labs ordered are listed, but only abnormal results are displayed) Labs Reviewed  POCT INFLUENZA A/B    EKG   Radiology No results found.  Procedures Procedures (including critical care time)  Medications Ordered in UC Medications - No data to display  Initial Impression / Assessment and Plan / UC Course  I have reviewed the triage vital signs and the nursing notes.  Pertinent labs & imaging results that were available during my care of the patient were reviewed by me and considered in my medical decision making (see chart for details).    New. Likely viral in nature. Treating with tessalon and Flonase. Discussed red flag signs and symptoms. Follow up PRN.    Final Clinical Impressions(s) / UC Diagnoses   Final diagnoses:  None   Discharge Instructions   None    ED Prescriptions   None    PDMP not reviewed this encounter.   Hughie Closs, Hershal Coria 04/28/21 2000

## 2021-04-27 LAB — COVID-19, FLU A+B NAA
Influenza A, NAA: NOT DETECTED
Influenza B, NAA: NOT DETECTED
SARS-CoV-2, NAA: NOT DETECTED

## 2021-05-10 ENCOUNTER — Telehealth: Payer: Self-pay | Admitting: Family Medicine

## 2021-05-10 ENCOUNTER — Ambulatory Visit: Payer: BC Managed Care – PPO | Admitting: Physician Assistant

## 2021-05-10 ENCOUNTER — Encounter: Payer: Self-pay | Admitting: Physician Assistant

## 2021-05-10 VITALS — BP 126/74 | HR 96 | Temp 97.9°F | Resp 16 | Ht 61.0 in | Wt 212.0 lb

## 2021-05-10 DIAGNOSIS — R0981 Nasal congestion: Secondary | ICD-10-CM | POA: Diagnosis not present

## 2021-05-10 DIAGNOSIS — J069 Acute upper respiratory infection, unspecified: Secondary | ICD-10-CM

## 2021-05-10 DIAGNOSIS — R059 Cough, unspecified: Secondary | ICD-10-CM

## 2021-05-10 LAB — POCT RAPID STREP A (OFFICE): Rapid Strep A Screen: NEGATIVE

## 2021-05-10 LAB — POCT INFLUENZA A/B
Influenza A, POC: NEGATIVE
Influenza B, POC: NEGATIVE

## 2021-05-10 MED ORDER — AMOXICILLIN 875 MG PO TABS: 875.0000 mg | ORAL_TABLET | Freq: Two times a day (BID) | ORAL | 0 refills | Status: AC

## 2021-05-10 MED ORDER — ONDANSETRON HCL 4 MG PO TABS: 4.0000 mg | ORAL_TABLET | Freq: Three times a day (TID) | ORAL | 0 refills | Status: AC | PRN

## 2021-05-10 NOTE — Telephone Encounter (Signed)
Pt's mother Aggie Cosier is calling in for assistance. Mom says that pt has been sick for 3 weeks. Mom says that she would like to know if pt could be prescribed Augmentin instead? Mom says that pt's father is Rx this medication w/the same sx and is feeling better. Mom feels that amoxicillin isn't going to help. She feels that pt is to sick.      [phone]  [facility] [address] [place] - [address]  [address] [place], [place] [address]   Please assist further.

## 2021-05-10 NOTE — Patient Instructions (Addendum)
Based on your described symptoms and the duration of symptoms it is likely that you have a bacterial upper respiratory infection  Symptoms can last for 3-10 days with lingering cough and intermittent symptoms lasting weeks after that.  The goal of treatment at this time is to reduce your symptoms and discomfort while the antibiotic reduces the bacterial infection.  I am sending in Amoxicillin 875mg  to be taken by mouth twice per day for 5 days.  I am sending in Zofran 4mg  to be taken as needed every 8 hours until your nausea and vomiting starts to resolve  You can use over the counter medications such as Dayquil/Nyquil, AlkaSeltzer formulations, etc to provide further relief of symptoms according to the manufacturer's instructions  If preferred you can use Coricidin to manage your symptoms rather than those medications mentioned above.  You can use a humidifier at night to help with nasal symptoms You can use saline rinses/sprays to help flush your nasal passages and provide mild relief.   If your symptoms do not improve or become worse in the next 5-7 days please make an apt at the office so we can see you  Go to the ER if you begin to have more serious symptoms such as shortness of breath, trouble breathing, loss of consciousness, swelling around the eyes, high fever, severe lasting headaches, vision changes or neck pain/stiffness.

## 2021-05-10 NOTE — Progress Notes (Signed)
Acute Office Visit  Subjective:    Patient ID: Alexandra Crane, female    DOB: [birth year], 25 y.o.   MRN: [id number]  Introduced myself to the patient as a PA-C and provided education on APPs in clinical practice.    Chief Complaint  Patient presents with   Cough   Nasal Congestion   Sore Throat    Cough Associated symptoms include headaches, rhinorrhea, a sore throat and shortness of breath. Pertinent negatives include no chest pain, ear pain, fever or myalgias.  Sore Throat  Associated symptoms include congestion, coughing, diarrhea, headaches, shortness of breath and vomiting. Pertinent negatives include no ear pain, neck pain or trouble swallowing.   Patient is in today for productive cough, congestion, with associated nausea, vomiting, and diarrhea She is here today with her mother  She has been using Mucinex  She took Tessalon pearls and Flonase when she was originally seen on 04/26/2021 but these did not provide relief and she has since stopped taking them She reports symptoms have not improved since going to urgent care 04/26/2021 and feels they have worsened. She reports decreased appetite, vomiting, and diarrhea during course of illness.     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  Family History  Problem Relation Age of Onset   Hypertension Mother        borderline   Hypothyroidism Mother    Hypertension Father        borderline   Hypothyroidism Sister    Hypertension Maternal Grandmother    Alzheimer's disease Maternal Grandmother    Alzheimer's disease Maternal Grandfather     Social History   Socioeconomic History   Marital status: Single    Spouse name: Not on file   Number of children: Not on file    Years of education: Not on file   Highest education level: Not on file  Occupational History   Not on file  Tobacco Use   Smoking status: Never   Smokeless tobacco: Never  Vaping Use   Vaping Use: Never used  Substance and Sexual Activity   Alcohol use: No    Alcohol/week: 0.0 standard drinks   Drug use: No   Sexual activity: Never  Other Topics Concern   Not on file  Social History Narrative   Not on file   Social Determinants of Health   Financial Resource Strain: Not on file  Food Insecurity: Not on file  Transportation Needs: Not on file  Physical Activity: Not on file  Stress: Not on file  Social Connections: Not on file  Intimate Partner Violence: Not on file    Outpatient Medications Prior to Visit  Medication Sig Dispense Refill   benzonatate (TESSALON) 100 MG capsule Take 1 capsule (100 mg total) by mouth every 8 (eight) hours. 21 capsule 0   cetirizine (ZYRTEC) 10 MG tablet Take 10 mg by mouth daily.     fluticasone (FLONASE) 50 MCG/ACT nasal spray Place 2 sprays into both nostrils daily. 16 mL 0   ibuprofen (ADVIL,MOTRIN) 200 MG tablet Take 200 mg by mouth every 6 (six) hours as needed. Taking 200 mg -400 mg tablet as needed depending on pain     SYNTHROID 100  MCG tablet TAKE 1 TABLETS ONCE DAILY MON/WED/FRI/SAT TAKE 1 AND 1/2 TABLETS ON SUN/TUES/THURS 102 tablet 1   triamcinolone cream (KENALOG) 0.1 % Apply 1 application topically 2 (two) times daily. 30 g 0   No facility-administered medications prior to visit.    No Known Allergies  Review of Systems  Constitutional:  Positive for fatigue. Negative for fever.  HENT:  Positive for congestion, rhinorrhea and sore throat. Negative for ear pain, sinus pressure, sinus pain and trouble swallowing.   Respiratory:  Positive for cough and shortness of breath.   Cardiovascular:  Negative for chest pain.  Gastrointestinal:  Positive for diarrhea, nausea and vomiting.  Musculoskeletal:  Negative for arthralgias,  myalgias, neck pain and neck stiffness.  Neurological:  Positive for dizziness and headaches. Negative for light-headedness.      Objective:    Physical Exam HENT:     Head: Normocephalic.     Right Ear: Hearing, ear canal and external ear normal. Tympanic membrane is bulging.     Left Ear: Hearing, ear canal and external ear normal. Tympanic membrane is bulging.     Ears:     Comments: Mildly bulging TM bilaterally    Nose:     Right Turbinates: Swollen.     Left Turbinates: Swollen.     Right Sinus: No maxillary sinus tenderness or frontal sinus tenderness.     Left Sinus: No maxillary sinus tenderness or frontal sinus tenderness.     Mouth/Throat:     Mouth: Mucous membranes are moist.     Pharynx: Oropharynx is clear. Uvula midline. Posterior oropharyngeal erythema present. No oropharyngeal exudate or uvula swelling.     Tonsils: No tonsillar exudate or tonsillar abscesses.  Neck:     Trachea: Trachea and phonation normal.  Cardiovascular:     Rate and Rhythm: Normal rate and regular rhythm.     Pulses: Normal pulses.     Heart sounds: Normal heart sounds.  Pulmonary:     Effort: Pulmonary effort is normal.     Breath sounds: Normal breath sounds and air entry. No decreased breath sounds, wheezing, rhonchi or rales.  Musculoskeletal:     Cervical back: Normal range of motion and neck supple.     Right lower leg: No edema.     Left lower leg: No edema.  Neurological:     Mental Status: She is alert.    BP 126/74    Pulse 96    Temp 97.9 F (36.6 C) (Oral)    Resp 16    Ht 5\' 1"  (1.549 m)    Wt 212 lb (96.2 kg)    SpO2 99%    BMI 40.06 kg/m  Wt Readings from Last 3 Encounters:  05/10/21 212 lb (96.2 kg)  09/09/20 204 lb (92.5 kg)  03/10/20 198 lb 12.8 oz (90.2 kg)    Health Maintenance Due  Topic Date Due   HPV VACCINES (1 - 2-dose series) Never done   TETANUS/TDAP  10/25/2019   COVID-19 Vaccine (3 - Booster for Pfizer series) 03/23/2020   INFLUENZA VACCINE   12/12/2020       Topic Date Due   HPV VACCINES (1 - 2-dose series) Never done     Lab Results  Component Value Date   TSH 0.78 03/10/2020   Lab Results  Component Value Date   WBC 6.4 03/10/2020   HGB 13.8 03/10/2020   HCT 41.7 03/10/2020   MCV 81.9 03/10/2020   PLT 386 03/10/2020   Lab  Results  Component Value Date   NA 136 03/10/2020   K 4.2 03/10/2020   CO2 25 03/10/2020   GLUCOSE 119 (H) 03/10/2020   BUN 11 03/10/2020   CREATININE 0.72 03/10/2020   BILITOT 0.3 03/10/2020   ALKPHOS 101 08/07/2016   AST 21 03/10/2020   ALT 26 03/10/2020   PROT 7.0 03/10/2020   ALBUMIN 4.4 08/07/2016   CALCIUM 9.2 03/10/2020   ANIONGAP 6 (L) 04/22/2013   Lab Results  Component Value Date   CHOL 160 03/10/2020   Lab Results  Component Value Date   HDL 44 (L) 03/10/2020   Lab Results  Component Value Date   LDLCALC 91 03/10/2020   Lab Results  Component Value Date   TRIG 145 03/10/2020   Lab Results  Component Value Date   CHOLHDL 3.6 03/10/2020   Lab Results  Component Value Date   HGBA1C 5.6 03/10/2020       Assessment & Plan:   Problem List Items Addressed This Visit   None  1. Upper respiratory tract infection, unspecified type  Visit with patient indicates symptoms cough, nasal congestion, fatigue, nausea, vomiting,  ongoing for 2 weeks congruent with acute URI  Negative for Influenza A & B on rapid test Negative for Strep A on rapid test Awaiting COVID results. She was seen in Urgent Care on 04/26/2021 for same symptoms and has not found improvement since - tessalon pearls and Flonase were ineffective Due to lingering nature and perceived worsening of symptoms per patient, antibiotic therapy appears appropriate  Amoxicillin 875mg  PO BID x5 days prescribed Zofran 4mg  PO PRN TID prescribed for nausea and vomiting.  Discussed with patient the various viral and bacterial etiologies of current illness and appropriate course of treatment Discussed OTC  medication options for multisymptom relief such as Dayquil/Nyquil, Theraflu, AlkaSeltzer, etc. Discussed return precautions if symptoms are not improving or worsen over next 5-7 days.    2. Cough, unspecified type Discussed OTC medications for relief.  Patient and mother states that Tessalon pearls were not helpful in previous 2 weeks Will consider sending in Guaifenasin/codeine for cough suppression if symptoms continue but discussed possible side effects with patient and she did not want this rx at today's visit.   3. Nasal congestion Discussed OTC medications for relief  Discussed treatment with antibiotic for suspected bacterial URI given duration and character of symptoms.     No orders of the defined types were placed in this encounter.  I, Alee Katen E Stormy Connon, PA-C, have reviewed all documentation for this visit. The documentation on 05/10/21 for the exam, diagnosis, procedures, and orders are all accurate and complete.   Zaeem Kandel E Jorgina Binning, PA-C

## 2021-05-11 LAB — SPECIMEN STATUS REPORT

## 2021-05-11 LAB — SARS-COV-2, NAA 2 DAY TAT

## 2021-05-11 LAB — NOVEL CORONAVIRUS, NAA: SARS-CoV-2, NAA: NOT DETECTED

## 2021-05-11 NOTE — Telephone Encounter (Signed)
Mother given message. Asking if the antibiotic "won't be changed, can she at least extend it more than 5 days?She also needs her work note extended through Friday." Please advise.

## 2021-05-28 ENCOUNTER — Other Ambulatory Visit: Payer: Self-pay | Admitting: Family Medicine

## 2021-05-28 DIAGNOSIS — E038 Other specified hypothyroidism: Secondary | ICD-10-CM

## 2021-05-28 NOTE — Telephone Encounter (Signed)
Requested medication (s) are due for refill today: yes  Requested medication (s) are on the active medication list: yes  Last refill:  09/09/20  Future visit scheduled: no  Notes to clinic:  overdue labs   Requested Prescriptions  Pending Prescriptions Disp Refills   SYNTHROID 100 MCG tablet [Pharmacy Med Name: SYNTHROID 100 MCG TABLET] 102 tablet 1    Sig: TAKE 1 TABLETS ONCE DAILY MON/WED/FRI/SAT TAKE 1 AND 1/2 TABLETS ON SUN/TUES/THURS     Endocrinology:  Hypothyroid Agents Failed - 05/28/2021  9:26 AM      Failed - TSH needs to be rechecked within 3 months after an abnormal result. Refill until TSH is due.      Failed - TSH in normal range and within 360 days    TSH  Date Value Ref Range Status  03/10/2020 0.78 mIU/L Final    Comment:              Reference Range .           > or = 20 Years  0.40-4.50 .                Pregnancy Ranges           First trimester    0.26-2.66           Second trimester   0.55-2.73           Third trimester    0.43-2.91           Passed - Valid encounter within last 12 months    Recent Outpatient Visits           2 weeks ago Upper respiratory tract infection, unspecified type   [facility] Mecum, Oswaldo Conroy, PA-C   8 months ago Prediabetes   [facility] Alba Cory, MD   1 year ago Viral syndrome   [facility] Jamelle Haring, MD   1 year ago Prediabetes   [facility] Alba Cory, MD   1 year ago Hypothyroidism, juvenile   [facility] Alba Cory, MD

## 2021-05-29 ENCOUNTER — Other Ambulatory Visit: Payer: Self-pay

## 2021-05-29 DIAGNOSIS — E038 Other specified hypothyroidism: Secondary | ICD-10-CM

## 2021-05-29 MED ORDER — SYNTHROID 100 MCG PO TABS: ORAL_TABLET | ORAL | 0 refills | Status: DC

## 2021-06-14 NOTE — Progress Notes (Signed)
Name: Alexandra Crane   MRN: [id number]    DOB: May 25, 1995   Date:06/15/2021       Progress Note  Subjective  Chief Complaint  Follow Up  HPI  Hypothyroidism: diagnosed in her pre-teen years, she has been taking Synthroid 100 mcg daily and an extra hal pill on S, T and T,  and TSH has been within normal limits.  Today she denies constipation or hair loss, she has dry skin    Obesity: she has continue to gain weight over time, Discussed increase in physical activity for heart health   Metabolic Syndrome: increase in abdominal girth, oligomenorrhea, insulin resistance and dyslipidemia, we will recheck labs today.    Pre-diabetes: she is trying to avoid sweets, drinking more water, denies polyphagia, polydipsia or polyuria, Last A1C was normal. 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 No pertinent surgical history.  Family History  Problem Relation Age of Onset   Hypertension Mother        borderline   Hypothyroidism Mother    Hypertension Father        borderline   Hypothyroidism Sister    Hypertension Maternal Grandmother    Alzheimer's disease Maternal Grandmother    Alzheimer's disease Maternal Grandfather     Social History   Tobacco Use   Smoking status: Never   Smokeless tobacco: Never  Substance Use Topics   Alcohol use: No    Alcohol/week: 0.0 standard drinks     Current Outpatient Medications:    SYNTHROID 100 MCG tablet, TAKE 1 TABLETS ONCE DAILY MON/WED/FRI/SAT TAKE 1 AND 1/2  TABLETS ON SUN/TUES/THURS, Disp: 15 tablet, Rfl: 0   cetirizine (ZYRTEC) 10 MG tablet, Take 10 mg by mouth daily. (Patient not taking: Reported on 06/15/2021), Disp: , Rfl:   No Known Allergies  I personally reviewed active problem list, medication list, allergies, family history, social history, health maintenance with the patient/caregiver today.   ROS  Constitutional: Negative for fever or weight change.  Respiratory: Negative for cough and shortness of breath.   Cardiovascular: Negative for chest pain or palpitations.  Gastrointestinal: Negative for abdominal pain, no bowel changes.  Musculoskeletal: Negative for gait problem or joint swelling.  Skin: Negative for rash.  Neurological: Negative for dizziness or headache.  No other specific complaints in a complete review of systems (except as listed in HPI above).   Objective  Vitals:   06/15/21 1404  BP: 120/80  Pulse: 97  Resp: 16  Temp: 98.2 F (36.8 C)  TempSrc: Oral  SpO2: 99%  Weight: 213 lb 14.4 oz (97 kg)  Height: 5\' 3"  (1.6 m)    Body mass index is 37.89 kg/m.  Physical Exam  Constitutional: Patient appears well-developed and well-nourished. Obese  No distress.  HEENT: head atraumatic, normocephalic, pupils equal and reactive to light, , neck supple Cardiovascular: Normal rate, regular rhythm and normal heart sounds.  No murmur heard. No BLE edema. Pulmonary/Chest: Effort normal and breath sounds normal. No respiratory distress. Abdominal: Soft.  There is no tenderness.  Psychiatric: Patient has a normal mood and affect. behavior is normal. Judgment and thought content normal.   Recent Results (from the past 2160 hour(s))  POCT Influenza A/B     Status: None   Collection Time: 04/26/21 12:40 PM  Result Value Ref Range   Influenza A, POC Negative Negative   Influenza B, POC Negative Negative  Covid-19, Flu A+B ([facility])     Status: None   Collection Time: 04/26/21  1:15 PM   Specimen: Nasopharyngeal    Naso  Result Value Ref Range   SARS-CoV-2, NAA Not Detected Not Detected   Influenza A, NAA Not Detected Not Detected   Influenza B, NAA Not Detected Not Detected   Test Information: Comment     Comment: This nucleic acid amplification test was developed and its performance characteristics determined by [employer]. Nucleic acid amplification tests include RT-PCR and TMA. This test has not been FDA cleared or approved. This test has been authorized by FDA under an Emergency Use Authorization (EUA). This test is only authorized for the duration of time the declaration that circumstances exist justifying the authorization of the emergency use of in vitro diagnostic tests for detection of SARS-CoV-2 virus and/or diagnosis of COVID-19 infection under section 564(b)(1) of the Act, 21 U.S.C. GF:7541899) (1), unless the authorization is terminated or revoked sooner. When diagnostic testing is negative, the possibility of a false negative result should be considered in the context of a patient's recent exposures and the presence of clinical signs and symptoms consistent with COVID-19. An individual without symptoms of COVID-19 and who is not shedding SARS-CoV-2 virus wo uld expect to have a negative (not detected) result in this assay.   Novel Coronavirus, NAA ([facility])     Status: None   Collection Time: 05/10/21 12:00 AM   Specimen: Nasopharyngeal(NP) swabs in vial transport medium   Nasopharynge  Resident  Result Value Ref Range   SARS-CoV-2, NAA Not Detected Not Detected    Comment: This nucleic acid amplification test was developed and its performance characteristics determined by [employer]. Nucleic acid amplification tests include RT-PCR and TMA. This test has not been FDA cleared or approved. This test has been authorized by FDA under an Emergency Use Authorization (EUA). This test is only authorized for the duration of time the declaration that circumstances  exist justifying the authorization of the emergency use of in vitro diagnostic tests for detection of SARS-CoV-2 virus and/or diagnosis of COVID-19 infection under section 564(b)(1) of the Act, 21 U.S.C. [id number]) (1), unless the authorization is terminated or revoked sooner. When diagnostic testing is negative, the possibility of a false negative result should be considered in the context of a patient's recent exposures and the presence of clinical signs and symptoms consistent with COVID-19. An individual without symptoms of COVID-19 and who is not shedding SARS-CoV-2 virus wo uld expect to have a negative (not detected) result in this assay.   SARS-COV-2, NAA 2 DAY TAT     Status: None   Collection Time: 05/10/21 12:00 AM   Nasopharynge  Resident  Result Value Ref Range   SARS-CoV-2, NAA 2 DAY TAT Performed   Specimen status report     Status: None   Collection Time: 05/10/21 12:00 AM  Result Value Ref Range   specimen status report Comment     Comment: Please note Please note The date and/or time of collection was not indicated on the requisition as required by state and federal law.  The date of receipt  of the specimen was used as the collection date if not supplied.   POCT Influenza A/B     Status: None   Collection Time: 05/10/21 10:16 AM  Result Value Ref Range   Influenza A, POC Negative Negative   Influenza B, POC Negative Negative  POCT rapid strep A     Status: None   Collection Time: 05/10/21 10:17 AM  Result Value Ref Range   Rapid Strep A Screen Negative Negative     PHQ2/9: Depression screen [facility] 2/9 06/15/2021 05/10/2021 09/09/2020 05/20/2020 03/10/2020  Decreased Interest 0 0 0 0 0  Down, Depressed, Hopeless 0 0 0 0 0  PHQ - 2 Score 0 0 0 0 0  Altered sleeping 0 0 - - 0  Tired, decreased energy 0 0 - - 0  Change in appetite 0 0 - - 0  Feeling bad or failure about yourself  0 0 - - 0  Trouble concentrating 0 0 - - 0  Moving slowly or fidgety/restless 0  0 - - 0  Suicidal thoughts 0 0 - - 0  PHQ-9 Score 0 0 - - 0  Difficult doing work/chores - Not difficult at all - - Not difficult at all  Some recent data might be hidden    phq 9 is negative   Fall Risk: Fall Risk  06/15/2021 05/10/2021 09/09/2020 05/20/2020 03/10/2020  Falls in the past year? 0 0 0 0 0  Number falls in past yr: - 0 0 0 0  Injury with Fall? - 0 0 0 1  Risk for fall due to : - No Fall Risks - - -  Follow up Falls prevention discussed Falls prevention discussed - - -      Functional Status Survey: Is the patient deaf or have difficulty hearing?: No Does the patient have difficulty seeing, even when wearing glasses/contacts?: No Does the patient have difficulty concentrating, remembering, or making decisions?: No Does the patient have difficulty walking or climbing stairs?: No Does the patient have difficulty dressing or bathing?: No Does the patient have difficulty doing errands alone such as visiting a doctor's office or shopping?: No    Assessment & Plan  1. Hypothyroidism, juvenile  - TSH  2. Prediabetes  - COMPLETE METABOLIC PANEL WITH GFR - Hemoglobin A1c  3. Need for immunization against influenza  - Flu Vaccine QUAD 74mo+IM (Fluarix, Fluzone & Alfiuria Quad PF)  4. Low serum HDL  - Lipid panel  5. Metabolic syndrome   6. Screening for deficiency anemia  - CBC with Differential/Platelet  7. Obesity (BMI 35.0-39.9 without comorbidity)   8. Seasonal allergic rhinitis, unspecified trigger   Doing well at this time

## 2021-06-15 ENCOUNTER — Encounter: Payer: Self-pay | Admitting: Family Medicine

## 2021-06-15 ENCOUNTER — Ambulatory Visit (INDEPENDENT_AMBULATORY_CARE_PROVIDER_SITE_OTHER): Payer: BC Managed Care – PPO | Admitting: Family Medicine

## 2021-06-15 VITALS — BP 120/80 | HR 97 | Temp 98.2°F | Resp 16 | Ht 63.0 in | Wt 213.9 lb

## 2021-06-15 DIAGNOSIS — E669 Obesity, unspecified: Secondary | ICD-10-CM

## 2021-06-15 DIAGNOSIS — E038 Other specified hypothyroidism: Secondary | ICD-10-CM

## 2021-06-15 DIAGNOSIS — R748 Abnormal levels of other serum enzymes: Secondary | ICD-10-CM

## 2021-06-15 DIAGNOSIS — R7303 Prediabetes: Secondary | ICD-10-CM | POA: Diagnosis not present

## 2021-06-15 DIAGNOSIS — J302 Other seasonal allergic rhinitis: Secondary | ICD-10-CM

## 2021-06-15 DIAGNOSIS — Z23 Encounter for immunization: Secondary | ICD-10-CM | POA: Diagnosis not present

## 2021-06-15 DIAGNOSIS — Z130 Encounter for screening for diseases of the blood and blood-forming organs and certain disorders involving the immune mechanism: Secondary | ICD-10-CM

## 2021-06-15 DIAGNOSIS — E8881 Metabolic syndrome: Secondary | ICD-10-CM

## 2021-06-16 LAB — COMPLETE METABOLIC PANEL WITH GFR
AG Ratio: 1.7 (calc) (ref 1.0–2.5)
ALT: 21 U/L (ref 6–29)
AST: 18 U/L (ref 10–30)
Albumin: 4.5 g/dL (ref 3.6–5.1)
Alkaline phosphatase (APISO): 75 U/L (ref 31–125)
BUN: 9 mg/dL (ref 7–25)
CO2: 25 mmol/L (ref 20–32)
Calcium: 9.5 mg/dL (ref 8.6–10.2)
Chloride: 104 mmol/L (ref 98–110)
Creat: 0.81 mg/dL (ref 0.50–0.96)
Globulin: 2.6 g/dL (calc) (ref 1.9–3.7)
Glucose, Bld: 129 mg/dL — ABNORMAL HIGH (ref 65–99)
Potassium: 4 mmol/L (ref 3.5–5.3)
Sodium: 138 mmol/L (ref 135–146)
Total Bilirubin: 0.2 mg/dL (ref 0.2–1.2)
Total Protein: 7.1 g/dL (ref 6.1–8.1)
eGFR: 105 mL/min/{1.73_m2} (ref >=60)

## 2021-06-16 LAB — LIPID PANEL
Cholesterol: 158 mg/dL (ref <200)
HDL: 47 mg/dL — ABNORMAL LOW (ref >=50)
LDL Cholesterol (Calc): 86 mg/dL (calc)
Non-HDL Cholesterol (Calc): 111 mg/dL (calc) (ref <130)
Total CHOL/HDL Ratio: 3.4 (calc) (ref <5.0)
Triglycerides: 155 mg/dL — ABNORMAL HIGH (ref <150)

## 2021-06-16 LAB — CBC WITH DIFFERENTIAL/PLATELET
Absolute Monocytes: 377 cells/uL (ref 200–950)
Basophils Absolute: 52 cells/uL (ref 0–200)
Basophils Relative: 0.7 %
Eosinophils Absolute: 67 cells/uL (ref 15–500)
Eosinophils Relative: 0.9 %
HCT: 42.1 % (ref 35.0–45.0)
Hemoglobin: 13.7 g/dL (ref 11.7–15.5)
Lymphs Abs: 1894 cells/uL (ref 850–3900)
MCH: 26.5 pg — ABNORMAL LOW (ref 27.0–33.0)
MCHC: 32.5 g/dL (ref 32.0–36.0)
MCV: 81.4 fL (ref 80.0–100.0)
MPV: 9.9 fL (ref 7.5–12.5)
Monocytes Relative: 5.1 %
Neutro Abs: 5010 cells/uL (ref 1500–7800)
Neutrophils Relative %: 67.7 %
Platelets: 408 10*3/uL — ABNORMAL HIGH (ref 140–400)
RBC: 5.17 10*6/uL — ABNORMAL HIGH (ref 3.80–5.10)
RDW: 12 % (ref 11.0–15.0)
Total Lymphocyte: 25.6 %
WBC: 7.4 10*3/uL (ref 3.8–10.8)

## 2021-06-16 LAB — HEMOGLOBIN A1C
Hgb A1c MFr Bld: 5.6 % of total Hgb (ref <5.7)
Mean Plasma Glucose: 114 mg/dL
eAG (mmol/L): 6.3 mmol/L

## 2021-06-16 LAB — TSH: TSH: 1.95 mIU/L

## 2021-06-29 ENCOUNTER — Encounter: Payer: Self-pay | Admitting: Nurse Practitioner

## 2021-06-29 ENCOUNTER — Telehealth (INDEPENDENT_AMBULATORY_CARE_PROVIDER_SITE_OTHER): Payer: BC Managed Care – PPO | Admitting: Nurse Practitioner

## 2021-06-29 ENCOUNTER — Other Ambulatory Visit: Payer: Self-pay

## 2021-06-29 ENCOUNTER — Other Ambulatory Visit: Payer: Self-pay | Admitting: Nurse Practitioner

## 2021-06-29 DIAGNOSIS — R1110 Vomiting, unspecified: Secondary | ICD-10-CM

## 2021-06-29 DIAGNOSIS — R197 Diarrhea, unspecified: Secondary | ICD-10-CM

## 2021-06-29 MED ORDER — PROMETHAZINE HCL 12.5 MG PO TABS: 12.5000 mg | ORAL_TABLET | Freq: Four times a day (QID) | ORAL | 0 refills | Status: DC | PRN

## 2021-06-29 MED ORDER — LOPERAMIDE HCL 2 MG PO CAPS: 2.0000 mg | ORAL_CAPSULE | Freq: Four times a day (QID) | ORAL | 0 refills | Status: DC | PRN

## 2021-06-29 NOTE — Progress Notes (Signed)
Name: Alexandra Crane   MRN: [id number]    DOB: 16-Jan-1996   Date:06/29/2021       Progress Note  Subjective  Chief Complaint  Chief Complaint  Patient presents with   Diarrhea   Emesis    Headache, fever 100.5 last night  for 1 day    I connected with  Jearline M Brower  on 06/29/21 at 10:25 am by a video enabled telemedicine application and verified that I am speaking with the correct person using two identifiers.  I discussed the limitations of evaluation and management by telemedicine and the availability of in person appointments. The patient expressed understanding and agreed to proceed with a virtual visit  Staff also discussed with the patient that there may be a patient responsible charge related to this service. Patient Location: home Provider Location: [facility] Additional Individuals present: alone  HPI  Vomiting/Diarrhea: She says last night she started feeling nauseated, vomiting and diarrhea.  She says that she did have chicken salad for lunch.  She says she has had multiple episodes of diarrhea and vomiting.  Discussed coming in for stool sample and blood work.  She will reach out tomorrow if no improvement and will do that then. Will treat symptoms for now and if no improvement she will follow up tomorrow.  She says she does have some abdominal cramping but not pain.  She says she did have chills last night with a temp of 100.5 but no fever today. She has taken zofran but it has not helped.  Discussed pushing fluids.  Will send in antidiarrhea and antinausea medication.   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 (Patient not taking: Reported on 06/15/2021), Disp: , Rfl:   No Known Allergies  I personally reviewed active problem list, medication list, allergies, notes from last encounter with the patient/caregiver today.  ROS  Constitutional: Negative for fever or weight change.  Respiratory: Negative for cough and shortness of breath.   Cardiovascular: Negative for chest pain or palpitations.  Gastrointestinal: Negative for abdominal pain, positive for abdominal cramping, vomiting and diarrhea Musculoskeletal: Negative for gait problem or joint swelling.  Skin: Negative for rash.  Neurological: Negative for dizziness or headache.  No other specific complaints in a complete review of systems (except as listed in HPI above).   Objective  Virtual encounter, vitals not obtained.  There is no height or weight on file to calculate BMI.  Nursing Note and Vital Signs reviewed.  Physical Exam  Awake, alert and oriented, speaking in complete sentences  No results found for this or any previous visit (from the past 72 hour(s)).  Assessment & Plan  1. Vomiting and diarrhea -push fluids -BRAT diet -follow up tomorrow if no improvement - promethazine (  PHENERGAN) 12.5 MG tablet; Take 1 tablet (12.5 mg total) by mouth every 6 (six) hours as needed for nausea or vomiting.  Dispense: 20 tablet; Refill: 0 - loperamide (IMODIUM) 2 MG capsule; Take 1 capsule (2 mg total) by mouth 4 (four) times daily as needed for diarrhea or loose stools.  Dispense: 20 capsule; Refill: 0   -Red flags and when to present for emergency care or RTC including fever >101.88F,  chest pain, shortness of breath, new/worsening/un-resolving symptoms,  reviewed with patient at time of visit. Follow up and care instructions discussed and provided in AVS. - I discussed the assessment and treatment plan with the patient. The patient was provided an opportunity to ask questions and all were answered. The patient agreed with the plan and demonstrated an understanding of the instructions.  I provided 15 minutes of non-face-to-face time during this encounter.  Bo Merino, FNP

## 2021-06-30 ENCOUNTER — Telehealth: Payer: Self-pay

## 2021-06-30 NOTE — Telephone Encounter (Signed)
Pt states has been having some headaches today but the nausea and diarrhea has been better. Pt states it looks like its getting better so far. No fever

## 2021-07-09 ENCOUNTER — Other Ambulatory Visit: Payer: Self-pay | Admitting: Family Medicine

## 2021-07-09 DIAGNOSIS — E038 Other specified hypothyroidism: Secondary | ICD-10-CM

## 2021-07-10 MED ORDER — SYNTHROID 100 MCG PO TABS: ORAL_TABLET | ORAL | 1 refills | Status: DC

## 2021-08-03 ENCOUNTER — Telehealth (INDEPENDENT_AMBULATORY_CARE_PROVIDER_SITE_OTHER): Payer: BC Managed Care – PPO | Admitting: Internal Medicine

## 2021-08-03 DIAGNOSIS — B349 Viral infection, unspecified: Secondary | ICD-10-CM

## 2021-08-03 DIAGNOSIS — R112 Nausea with vomiting, unspecified: Secondary | ICD-10-CM

## 2021-08-03 MED ORDER — ONDANSETRON 8 MG PO TBDP: 8.0000 mg | ORAL_TABLET | Freq: Three times a day (TID) | ORAL | 0 refills | Status: DC | PRN

## 2021-08-03 NOTE — Patient Instructions (Signed)
It was great seeing you today! ? ?Plan discussed at today's visit: ?-Take Zofran first to decrease nausea but can alternate with Phengran which can make you sleepy, so take that at night ?-Try to stay well hydrated but eat as tolerated. Broths, toast, bland foods good at first, no dairy or fat ?-Let me know if nausea does not subside ? ?Follow up in: as needed ? ?Take care and let us know if you have any questions or concerns prior to your next visit. ? ?Dr. Caralee Ates ? ?

## 2021-08-03 NOTE — Progress Notes (Signed)
Virtual Visit via Video Note ? ?I connected with Alexandra Crane on 08/03/21 at 11:40 AM EDT by a video enabled telemedicine application and verified that I am speaking with the correct person using two identifiers. ? ?Location: ?Patient: Home ?Provider: [facility] ?  ?I discussed the limitations of evaluation and management by telemedicine and the availability of in person appointments. The patient expressed understanding and agreed to proceed. ? ?History of Present Illness: ? ?Alexandra Crane is a 26 year old female presenting via telemedicine for nausea and fever. Symptoms began 2 days ago with fever 101, was having chills as well. Took Tylenol and this resolved but now she is having nausea/vomiting for 2 days now. Vomiting every hour or so, last time was 20 minutes ago. No sick contacts. No abdominal pain. Some diarrhea, no constipation, blood in stools. No urinary symptoms, no dysuria, hematuria.  Still urinating well. Drinking water and trying to stay hydrated. Eating a little bit - soup and crackers. Has Phenergan to take but it makes her dizzy. ? ?Observations/Objective: ? ?General: sick appearing, no acute distress ?ENT: conjunctiva normal appearing bilaterally  ?Neuro: answers questions appropriately  ? ?Assessment and Plan: ? ?1. Nausea and vomiting, unspecified vomiting type/Viral illness: Most likely viral in nature, fevers/chills already resolved. Zofran sent to pharmacy to help with nausea, stay well hydrated and eat bland foods as tolerated. Will call tomorrow if symptoms worsening. Work note given.  ? ?- ondansetron (ZOFRAN-ODT) 8 MG disintegrating tablet; Take 1 tablet (8 mg total) by mouth every 8 (eight) hours as needed for nausea or vomiting.  Dispense: 20 tablet; Refill: 0 ? ? ?Follow Up Instructions: as needed  ? ?  ?I discussed the assessment and treatment plan with the patient. The patient was provided an opportunity to ask questions and all were answered. The patient agreed with the plan and demonstrated  an understanding of the instructions. ?  ?The patient was advised to call back or seek an in-person evaluation if the symptoms worsen or if the condition fails to improve as anticipated. ? ?I provided 11 minutes of non-face-to-face time during this encounter. ? ? ?Margarita Mail, DO ? ?

## 2021-09-29 ENCOUNTER — Encounter: Payer: Self-pay | Admitting: Family Medicine

## 2021-09-29 ENCOUNTER — Ambulatory Visit (INDEPENDENT_AMBULATORY_CARE_PROVIDER_SITE_OTHER): Payer: BC Managed Care – PPO | Admitting: Family Medicine

## 2021-09-29 ENCOUNTER — Other Ambulatory Visit: Payer: Self-pay

## 2021-09-29 VITALS — BP 126/76 | HR 113 | Resp 16 | Ht 61.0 in | Wt 209.0 lb

## 2021-09-29 DIAGNOSIS — E038 Other specified hypothyroidism: Secondary | ICD-10-CM

## 2021-09-29 DIAGNOSIS — T162XXA Foreign body in left ear, initial encounter: Secondary | ICD-10-CM | POA: Diagnosis not present

## 2021-09-29 DIAGNOSIS — H6012 Cellulitis of left external ear: Secondary | ICD-10-CM | POA: Diagnosis not present

## 2021-09-29 DIAGNOSIS — F419 Anxiety disorder, unspecified: Secondary | ICD-10-CM | POA: Diagnosis not present

## 2021-09-29 MED ORDER — SYNTHROID 100 MCG PO TABS: ORAL_TABLET | ORAL | 1 refills | Status: DC

## 2021-09-29 MED ORDER — LIDOCAINE HCL (PF) 1 % IJ SOLN
2.0000 mL | Freq: Once | INTRAMUSCULAR | Status: AC
  Administered 2021-09-29: 2 mL via INTRADERMAL

## 2021-09-29 MED ORDER — LORAZEPAM 0.5 MG PO TABS: 0.2500 mg | ORAL_TABLET | Freq: Once | ORAL | 0 refills | Status: DC | PRN

## 2021-09-29 MED ORDER — SULFAMETHOXAZOLE-TRIMETHOPRIM 800-160 MG PO TABS: 1.0000 | ORAL_TABLET | Freq: Two times a day (BID) | ORAL | 0 refills | Status: AC

## 2021-10-06 ENCOUNTER — Encounter: Payer: Self-pay | Admitting: Family Medicine

## 2021-10-06 DIAGNOSIS — T162XXA Foreign body in left ear, initial encounter: Secondary | ICD-10-CM | POA: Diagnosis not present

## 2021-10-06 NOTE — Progress Notes (Signed)
Patient ID: Alexandra Crane, female    DOB: 1996/05/06, 26 y.o.   MRN: [id number]  PCP: Steele Sizer, MD  Chief Complaint  Patient presents with   Facial Swelling    L ear, piercing stuck in ear.    Subjective:   Alexandra Crane is a 26 y.o. female, presents to clinic with CC of the following:  HPI  Patient presents for left earlobe swelling with foreign body of earring retained in her earlobe She got her ears pierced about a month ago She noticed some itching of her earlobe over the last couple days but she did not have any pain or purulent discharge from the ear, she went to bed last night and everything was normal and she woke up this morning with her left earlobe swollen and the earring in the middle of her ear she endorses severe pain and she is very anxious and upset since she is getting married tomorrow She has been unable to remove the earring herself and presents here for assistance in evaluation  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   Prediabetes 07/09/2016   Family planning 11/22/2014   Flexural eczema 11/22/2014   Hypothyroidism, 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   Obesity (BMI 35.0-39.9 without comorbidity) 11/22/2014      Current Outpatient Medications:    cetirizine (ZYRTEC) 10 MG tablet, Take 10 mg by mouth daily., Disp: , Rfl:    loperamide (IMODIUM) 2 MG capsule, Take 1 capsule (2 mg total) by mouth 4 (four) times daily as needed for diarrhea or loose stools., Disp: 20 capsule, Rfl: 0   LORazepam (ATIVAN) 0.5 MG tablet, Take 0.5-1 tablets (0.25-0.5 mg total) by mouth once as needed for up to 2 doses for anxiety., Disp: 2 tablet, Rfl: 0   ondansetron (ZOFRAN-ODT) 8 MG disintegrating tablet, Take 1 tablet (8 mg total) by mouth every 8 (eight) hours as needed for nausea or vomiting., Disp: 20 tablet, Rfl: 0   promethazine (PHENERGAN)  12.5 MG tablet, Take 1 tablet (12.5 mg total) by mouth every 6 (six) hours as needed for nausea or vomiting., Disp: 20 tablet, Rfl: 0   SYNTHROID 100 MCG tablet, TAKE 1 TABLETS ONCE DAILY MON/WED/FRI/SAT TAKE 1 AND 1/2 TABLETS ON SUN/TUES/THURS, Disp: 102 tablet, Rfl: 1   No Known Allergies   Social History   Tobacco Use   Smoking status: Never   Smokeless tobacco: Never  Vaping Use   Vaping Use: Never used  Substance Use Topics   Alcohol use: No    Alcohol/week: 0.0 standard drinks   Drug use: No      Chart Review Today: I personally reviewed active problem list, medication list, allergies, family history, social history, health maintenance, notes from last encounter, lab results, imaging with the patient/caregiver today.   Review of Systems  Constitutional: Negative.   HENT: Negative.    Eyes: Negative.   Respiratory: Negative.    Cardiovascular: Negative.   Gastrointestinal: Negative.   Endocrine: Negative.   Genitourinary: Negative.   Musculoskeletal: Negative.   Skin: Negative.   Allergic/Immunologic: Negative.   Neurological: Negative.   Hematological: Negative.   Psychiatric/Behavioral: Negative.    All other systems reviewed and are negative.     Objective:   Vitals:   09/29/21 1116  BP: 126/76  Pulse: (!) 113  Resp: 16  SpO2: 99%  Weight: 209 lb (94.8 kg)  Height: 5\' 1"  (1.549 m)    Body mass index is 39.49 kg/m.  Physical Exam Vitals and nursing note reviewed.  Constitutional:      General: She is not in acute distress.    Appearance: She is not ill-appearing, toxic-appearing or diaphoretic.  HENT:     Head: Normocephalic and atraumatic.     Ears:     Comments: Left earlobe edematous and mildly erythematous, the class on the back of the earring is visible but the small stud roughly 2-3 mm in diameter is not visualized and is able to be manipulated and palpated in middle of lobe, scant blood, no purulent discharge, very ttp Neurological:      Mental Status: She is alert.  Psychiatric:        Attention and Perception: Attention normal.        Mood and Affect: Mood is anxious. Affect is tearful.        Speech: Speech normal.        Behavior: Behavior normal. Behavior is cooperative.     Foreign Body Removal  Date/Time: 10/06/2021 7:57 PM Performed by: Delsa Grana, PA-C Authorized by: Delsa Grana, PA-C  Body area: ear Location details: left ear Anesthesia: local infiltration  Anesthesia: Local Anesthetic: lidocaine 1% without epinephrine Anesthetic total: 1.5 mL  Sedation: Patient sedated: no  Patient restrained: no Patient cooperative: yes Localization method: probed and visualized Removal mechanism: forceps (manual manipulation, able to push earring through anterior ear opening after some manipulation, did not have to incise lobe, earring was then separated from back and removed completely from ear) Complexity: simple 1 objects recovered. Objects recovered: earring Post-procedure assessment: foreign body removed Patient tolerance: patient tolerated the procedure well with no immediate complications Comments: Verbal consent obtained prior to procedure - to numb and incise and remove earring if needed Scant blood loss No purulent discharge throughout Wound care done after        Assessment & Plan:   1. Acute foreign body of left earlobe, initial encounter Wound care reviewed Abx on hold - currently does not appear to have cellulitis and there has been no purulent drainage Irrigation and good wound care may be all that is needed for the piercing and earlobe to heal If any signs of worsening pain swelling or purulent discharge patient was instructed to start the Bactrim She is going out of the state tomorrow she will fill the medicine and take it with her just in case - sulfamethoxazole-trimethoprim (BACTRIM DS) 800-160 MG tablet; Take 1 tablet by mouth 2 (two) times daily for 5 days.  Dispense: 10 tablet;  Refill: 0 - lidocaine (PF) (XYLOCAINE) 1 % injection 2 mL - Foreign Body Removal  2. Cellulitis of left earlobe See above - sulfamethoxazole-trimethoprim (BACTRIM DS) 800-160 MG tablet; Take 1 tablet by mouth 2 (two) times daily for 5 days.  Dispense: 10 tablet; Refill: 0  3. Anxiety The patient was extremely distraught in the exam room today and is very stressed due to her wedding tomorrow.  She is also flying after that and has a lot of anxiety regarding flying we reviewed the side effects and possible risk with taking a benzo diazepam she was instructed not to take with any other sedating medications or with alcohol.  Encouraged her to try the lowest dose because her flights are from [place] in [place] to [place] and they are very short in duration I do not want her to be oversedated for her honeymoon.  She will  be accompanied by her fianc and then her husband the entire time. Controlled substance database reviewed prior to prescribing Ativan - LORazepam (ATIVAN) 0.5 MG tablet; Take 0.5-1 tablets (0.25-0.5 mg total) by mouth once as needed for up to 2 doses for anxiety.  Dispense: 2 tablet; Refill: 0   Return for As needed if not improving.      Delsa Grana, PA-C 10/06/21 7:55 PM

## 2021-11-20 ENCOUNTER — Encounter: Payer: Self-pay | Admitting: Family Medicine

## 2021-11-20 ENCOUNTER — Telehealth: Payer: BC Managed Care – PPO | Admitting: Family Medicine

## 2021-11-20 VITALS — Ht 61.0 in | Wt 209.0 lb

## 2021-11-20 DIAGNOSIS — J111 Influenza due to unidentified influenza virus with other respiratory manifestations: Secondary | ICD-10-CM | POA: Diagnosis not present

## 2021-11-20 DIAGNOSIS — R197 Diarrhea, unspecified: Secondary | ICD-10-CM

## 2021-11-20 DIAGNOSIS — R1110 Vomiting, unspecified: Secondary | ICD-10-CM

## 2021-11-20 MED ORDER — PROMETHAZINE HCL 12.5 MG PO TABS: 12.5000 mg | ORAL_TABLET | Freq: Four times a day (QID) | ORAL | 0 refills | Status: DC | PRN

## 2021-11-20 NOTE — Progress Notes (Signed)
   Name: Alexandra Crane   MRN: [id number]    DOB: 10/15/1995   Date:11/20/2021       Progress Note  Subjective  Chief Complaint  Chief Complaint  Patient presents with   Headache   Fever   Chills   Nausea    I connected with  Steph M Rendleman  on 11/20/21 at  1:00 PM EDT by a video enabled telemedicine application and verified that I am speaking with the correct person using two identifiers.  I discussed the limitations of evaluation and management by telemedicine and the availability of in person appointments. The patient expressed understanding and agreed to proceed with the virtual visit  Staff also discussed with the patient that there may be a patient responsible charge related to this service.  Patient Location: home Provider Location: [facility] Additional Individuals present: none  HPI  UPPER RESPIRATORY TRACT INFECTION - started Wednesday 7/5 with nausea, worse Friday with sore throat, body aches, headache, fever. - started feeling better yesterday but then with fever last night and nausea.  - COVID negative yesterday  Fever: yes, last one last night. Tmax 100.79F. 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  Body mass index is 39.49 kg/m.  Physical Exam Tired appearing, in NAD. Speaks in full sentences, congested sounding. Comfortable WOB on RA. No resp distress.    Assessment & Plan  Influenza-like illness COVID negative. Will defer flu testing as would not change management. Symptoms improving although nausea persistent, will provide  antiemetic. Work note provided. Return and emergency precautions discussed.    I discussed the assessment and treatment plan with the patient. The patient was provided an opportunity to ask questions and all were answered. The patient agreed with the plan and demonstrated an understanding of the instructions.  The patient was advised to call back or seek an in-person evaluation if the symptoms worsen or if the condition fails to improve as anticipated.  I provided 9 minutes of non-face-to-face time during this encounter.

## 2021-12-12 NOTE — Progress Notes (Deleted)
Name: Alexandra Crane   MRN: [id number]    DOB: 08-02-1995   Date:12/12/2021       Progress Note  Subjective  Chief Complaint  Follow Up  HPI  Hypothyroidism: diagnosed in her pre-teen years, she has been taking Synthroid 100 mcg daily and an extra hal pill on S, T and T,  and TSH has been within normal limits.  Today she denies constipation or hair loss, she has dry skin    Obesity: she has continue to gain weight over time, Discussed increase in physical activity for heart health   Metabolic Syndrome: increase in abdominal girth, oligomenorrhea, insulin resistance and dyslipidemia, we will recheck labs today.    Pre-diabetes: she is trying to avoid sweets, drinking more water, denies polyphagia, polydipsia or polyuria, Last A1C was normal. 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  Family History  Problem Relation Age of Onset   Hypertension Mother        borderline   Hypothyroidism Mother    Hypertension Father        borderline   Hypothyroidism Sister    Hypertension Maternal Grandmother    Alzheimer's disease Maternal Grandmother    Alzheimer's disease Maternal Grandfather     Social History   Tobacco Use   Smoking status: Never   Smokeless tobacco: Never  Substance Use Topics   Alcohol use: No    Alcohol/week: 0.0 standard drinks of alcohol     Current Outpatient Medications:    cetirizine (ZYRTEC) 10 MG tablet, Take 10 mg by mouth daily., Disp: , Rfl:    loperamide  (IMODIUM) 2 MG capsule, Take 1 capsule (2 mg total) by mouth 4 (four) times daily as needed for diarrhea or loose stools. (Patient not taking: Reported on 11/20/2021), Disp: 20 capsule, Rfl: 0   LORazepam (ATIVAN) 0.5 MG tablet, Take 0.5-1 tablets (0.25-0.5 mg total) by mouth once as needed for up to 2 doses for anxiety. (Patient not taking: Reported on 11/20/2021), Disp: 2 tablet, Rfl: 0   ondansetron (ZOFRAN-ODT) 8 MG disintegrating tablet, Take 1 tablet (8 mg total) by mouth every 8 (eight) hours as needed for nausea or vomiting. (Patient not taking: Reported on 11/20/2021), Disp: 20 tablet, Rfl: 0   promethazine (PHENERGAN) 12.5 MG tablet, Take 1 tablet (12.5 mg total) by mouth every 6 (six) hours as needed for nausea or vomiting., Disp: 20 tablet, Rfl: 0   SYNTHROID 100 MCG tablet, TAKE 1 TABLETS ONCE DAILY MON/WED/FRI/SAT TAKE 1 AND 1/2 TABLETS ON SUN/TUES/THURS, Disp: 102 tablet, Rfl: 1  No Known Allergies  I personally reviewed active problem list, medication list, allergies, family history, social history, health maintenance with the patient/caregiver today.   ROS  ***  Objective  There were no vitals filed for this visit.  There is no height or weight on file to calculate BMI.  Physical Exam ***  No results found for this or any previous visit (from the past 2160 hour(s)).   PHQ2/9:    11/20/2021  12:37 PM 09/29/2021   11:17 AM 06/29/2021    9:50 AM 06/15/2021    2:07 PM 05/10/2021    9:16 AM  Depression screen PHQ 2/9  Decreased Interest 0 0 0 0 0  Down, Depressed, Hopeless 0 0 0 0 0  PHQ - 2 Score 0 0 0 0 0  Altered sleeping 0   0 0  Tired, decreased energy 0   0 0  Change in appetite 0   0 0  Feeling bad or failure about yourself  0   0 0  Trouble concentrating 0   0 0  Moving slowly or fidgety/restless 0   0 0  Suicidal thoughts 0   0 0  PHQ-9 Score 0   0 0  Difficult doing work/chores     Not difficult at all    phq 9 is {gen pos [id number]}   Fall  Risk:    11/20/2021   12:37 PM 09/29/2021   11:17 AM 06/29/2021    9:50 AM 06/15/2021    2:07 PM 05/10/2021    9:15 AM  Fall Risk   Falls in the past year? 0 0 0 0 0  Number falls in past yr:  0 0  0  Injury with Fall?  0 0  0  Risk for fall due to : No Fall Risks No Fall Risks   No Fall Risks  Follow up Falls prevention discussed Falls prevention discussed Falls evaluation completed Falls prevention discussed Falls prevention discussed      Functional Status Survey:      Assessment & Plan  *** There are no diagnoses linked to this encounter.

## 2021-12-13 ENCOUNTER — Ambulatory Visit: Payer: BC Managed Care – PPO | Admitting: Family Medicine

## 2022-02-20 ENCOUNTER — Telehealth (INDEPENDENT_AMBULATORY_CARE_PROVIDER_SITE_OTHER): Payer: BC Managed Care – PPO | Admitting: Family Medicine

## 2022-02-20 DIAGNOSIS — J209 Acute bronchitis, unspecified: Secondary | ICD-10-CM | POA: Diagnosis not present

## 2022-02-20 DIAGNOSIS — R051 Acute cough: Secondary | ICD-10-CM

## 2022-02-20 MED ORDER — PROMETHAZINE-DM 6.25-15 MG/5ML PO SYRP: 2.5000 mL | ORAL_SOLUTION | Freq: Four times a day (QID) | ORAL | 0 refills | Status: DC | PRN

## 2022-02-20 MED ORDER — PREDNISONE 20 MG PO TABS: 40.0000 mg | ORAL_TABLET | Freq: Every day | ORAL | 0 refills | Status: AC

## 2022-02-20 MED ORDER — ONDANSETRON HCL 4 MG PO TABS: 4.0000 mg | ORAL_TABLET | Freq: Three times a day (TID) | ORAL | 0 refills | Status: DC | PRN

## 2022-02-20 MED ORDER — ALBUTEROL SULFATE HFA 108 (90 BASE) MCG/ACT IN AERS
2.0000 | INHALATION_SPRAY | Freq: Four times a day (QID) | RESPIRATORY_TRACT | 0 refills | Status: DC | PRN
Start: 2022-02-20 — End: 2022-08-20

## 2022-02-20 NOTE — Progress Notes (Signed)
Name: Alexandra Crane   MRN: [id number]    DOB: [birth year]   Date:02/20/2022       Progress Note  Subjective:    I connected with  Alexandra Crane  on 02/20/22 at 11:40 AM EDT by a video enabled telemedicine application and verified that I am speaking with the correct person using two identifiers.  I discussed the limitations of evaluation and management by telemedicine and the availability of in person appointments. The patient expressed understanding and agreed to proceed. Staff also discussed with the patient that there may be a patient responsible charge related to this service. Patient Location: home Provider Location: [facility] clinic  Additional Individuals present: none  Chief Complaint  Chief Complaint  Patient presents with   Nausea    Other sx started past Friday, will do a COVID home test after triaged.   Emesis   Cough    Last wednesday   Headache    HPI Last wed (6 d ago) sx started with cough and still has it  N/V x 4 d mostly associated with post-tussive emesis - emesis is stomach contents/food - has trouble hydrating or keeping food down, like soup - she denies diarrhea or abdominal pain and says almost all vomiting is associated with her coughing. 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, Disp: 20 capsule, Rfl: 0   LORazepam (ATIVAN) 0.5 MG tablet, Take 0.5-1 tablets (0.25-0.5 mg total) by mouth once as needed for up to 2 doses for anxiety., Disp: 2 tablet, Rfl: 0   ondansetron (ZOFRAN-ODT) 8 MG disintegrating tablet, Take 1 tablet (8 mg total) by mouth every 8 (eight) hours as needed for nausea or vomiting., Disp: 20 tablet, Rfl: 0   promethazine (PHENERGAN) 12.5 MG tablet, Take 1 tablet (12.5 mg total) by mouth every 6 (six) hours as needed for nausea or vomiting., Disp: 20 tablet, Rfl: 0   SYNTHROID 100 MCG tablet, TAKE 1 TABLETS ONCE DAILY MON/WED/FRI/SAT TAKE 1 AND 1/2 TABLETS ON SUN/TUES/THURS, Disp: 102 tablet, Rfl: 1  No Known Allergies  I personally reviewed active problem list, medication list, allergies, family history, social history, health maintenance, notes from last encounter, lab results, imaging with the patient/caregiver today.   Review of Systems  Constitutional:  Negative.   HENT: Negative.    Eyes: Negative.   Respiratory: Negative.    Cardiovascular: Negative.   Gastrointestinal: Negative.   Endocrine: Negative.   Genitourinary: Negative.   Musculoskeletal: Negative.   Skin: Negative.   Allergic/Immunologic: Negative.   Neurological: Negative.   Hematological: Negative.   Psychiatric/Behavioral: Negative.    All other systems reviewed and are negative.     Objective:   Virtual encounter, vitals limited, only able to obtain  the following There were no vitals filed for this visit. There is no height or weight on file to calculate BMI. Nursing Note and Vital Signs reviewed.  Physical Exam Vitals and nursing note reviewed.  Constitutional:      General: She is not in acute distress.    Appearance: She is obese. She is not ill-appearing, toxic-appearing or diaphoretic.     Comments: Appears mildly ill, but non-toxic, NAD  Pulmonary:     Effort: No respiratory distress.     Comments: Frequent coughing fits throughout visit, no observed retractions, accessory muscle use, tachypnea, able to speak in full sentences, no audible wheeze or stridor Neurological:     Mental Status: She is alert.     PE limited by virtual encounter  No results found for this or any previous visit (from the past 72 hour(s)).  Assessment and Plan:   1. Acute bronchitis, unspecified organism Pt with 6 days of coughing, sounds like bronchospasm and slightly tight through video encounter - short fits - no distress or post tussive emesis during visit and she appears overall alert and well, no associated nasal/sinus/URI or allergy sx, she has no hx of asthma Causing posttussive emesis w/o other GI sx Suspect bronchitis with bronchospasm - steroids and inhaler may be helpful, promethazine likely to help with both cough and nausea/vomiting She can use zofran for refractory n/v but it sounds all post-tussive She denies fever, pleuritic CP, SOB, DOE or fatigue so at this time I doubt CAP Encouraged her to stay out of work another day or two and to start meds and return only when fever free, no vomiting and some improved respiratory sx for more than 24 hours Suspect her HA may be secondary to mild hypovolemia - antiemetics to help with that, encouraged her to push fluids and try tylenol/NSAIDs  - promethazine-dextromethorphan (PROMETHAZINE-DM) 6.25-15 MG/5ML syrup; Take 2.5-5 mLs by mouth 4 (four) times daily as needed for cough.  Dispense: 118  mL; Refill: 0 - predniSONE (DELTASONE) 20 MG tablet; Take 2 tablets (40 mg total) by mouth daily with breakfast for 5 days.  Dispense: 10 tablet; Refill: 0 - albuterol (VENTOLIN HFA) 108 (90 Base) MCG/ACT inhaler; Inhale 2 puffs into the lungs every 6 (six) hours as needed for wheezing or shortness of breath.  Dispense: 8 g; Refill: 0 - ondansetron (ZOFRAN) 4 MG tablet; Take 1 tablet (4 mg total) by mouth every 8 (eight) hours as needed for refractory nausea / vomiting.  Dispense: 20 tablet; Refill: 0 - DG Chest 2 View; Future  2. Acute cough If not improvement with her sx in 3-5 day, or with any worsening (CP, SOB, new fever) pt was encouraged to get CXR and follow up  - DG Chest 2 View; Future    Plan reviewed with pt Work note sent to her via mychart  -Red flags and when to present for emergency care or RTC includingchest pain, shortness of breath, new/worsening/un-resolving symptoms, reviewed with patient at time of visit. Follow up and care instructions  discussed and provided in AVS. - I discussed the assessment and treatment plan with the patient. The patient was provided an opportunity to ask questions and all were answered. The patient agreed with the plan and demonstrated an understanding of the instructions.  I provided 23+ minutes of non-face-to-face time during this encounter.  Delsa Grana, PA-C 02/20/22 12:00 PM

## 2022-02-23 NOTE — Progress Notes (Deleted)
Name: Alexandra Crane   MRN: [id number]    DOB: 1996-03-10   Date:02/23/2022       Progress Note  Subjective  Chief Complaint  Annual Exam  HPI  Patient presents for annual CPE.  Diet: *** Exercise: ***  Last Eye Exam: *** Last Dental Exam: ***  Flowsheet Row Video Visit from 06/29/2021 in [facility]  AUDIT-C Score 0      Depression: Phq 9 is  {Desc; negative/positive:13464}    02/20/2022   10:44 AM 11/20/2021   12:37 PM 09/29/2021   11:17 AM 06/29/2021    9:50 AM 06/15/2021    2:07 PM  Depression screen PHQ 2/9  Decreased Interest 0 0 0 0 0  Down, Depressed, Hopeless 0 0 0 0 0  PHQ - 2 Score 0 0 0 0 0  Altered sleeping 0 0   0  Tired, decreased energy 0 0   0  Change in appetite 0 0   0  Feeling bad or failure about yourself  0 0   0  Trouble concentrating 0 0   0  Moving slowly or fidgety/restless 0 0   0  Suicidal thoughts 0 0   0  PHQ-9 Score 0 0   0  Difficult doing work/chores Not difficult at all       Hypertension: BP Readings from Last 3 Encounters:  09/29/21 126/76  06/15/21 120/80  05/10/21 126/74   Obesity: Wt Readings from Last 3 Encounters:  11/20/21 209 lb (94.8 kg)  09/29/21 209 lb (94.8 kg)  06/15/21 213 lb 14.4 oz (97 kg)   BMI Readings from Last 3 Encounters:  11/20/21 39.49 kg/m  09/29/21 39.49 kg/m  06/15/21 37.89 kg/m     Vaccines:   HPV: N/A Tdap: 10/24/09, due Shingrix: N/A Pneumonia: N/A Flu: due COVID-19: up to date   Hep C Screening:  STD testing and prevention (HIV/chl/gon/syphilis):  Intimate partner violence: negative screen  Sexual History : Menstrual History/LMP/Abnormal Bleeding:  Discussed importance of follow up if any post-menopausal bleeding: not applicable  Incontinence Symptoms: negative for symptoms   Breast cancer:  - Last Mammogram: N/A - BRCA gene screening: N/A   Osteoporosis Prevention : Discussed high calcium and vitamin D supplementation, weight bearing exercises Bone  density: N/A   Cervical cancer screening: 02/13/19, due  Skin cancer: Discussed monitoring for atypical lesions  Colorectal cancer: N/A   Lung cancer:  Low Dose CT Chest recommended if Age 79-80 years, 20 pack-year currently smoking OR have quit w/in 15years. Patient does not qualify for screen   ECG: N/A  Advanced Care Planning: A voluntary discussion about advance care planning including the explanation and discussion of advance directives.  Discussed health care proxy and Living will, and the patient was able to identify a health care proxy as ***.  Patient {DOES_DOES JJO:84166} have a living will and power of attorney of health care   Lipids: Lab Results  Component Value Date   CHOL 158 06/15/2021   CHOL 160 03/10/2020   CHOL 140 12/09/2018   Lab Results  Component Value Date   HDL 47 (L) 06/15/2021   HDL 44 (L) 03/10/2020   HDL 49 (L) 12/09/2018   Lab Results  Component Value Date   LDLCALC 86 06/15/2021   LDLCALC 91 03/10/2020   LDLCALC 73 12/09/2018   Lab Results  Component Value Date   TRIG 155 (H) 06/15/2021   TRIG 145 03/10/2020   TRIG 101 12/09/2018   Lab  Results  Component Value Date   CHOLHDL 3.4 06/15/2021   CHOLHDL 3.6 03/10/2020   CHOLHDL 2.9 12/09/2018   No results found for: "LDLDIRECT"  Glucose: Glucose  Date Value Ref Range Status  04/22/2013 107 (H) 65 - 99 mg/dL Final   Glucose, Bld  Date Value Ref Range Status  06/15/2021 129 (H) 65 - 99 mg/dL Final    Comment:    .            Fasting reference interval . For someone without known diabetes, a glucose value >125 mg/dL indicates that they may have diabetes and this should be confirmed with a follow-up test. .   03/10/2020 119 (H) 65 - 99 mg/dL Final    Comment:    .            Fasting reference interval . For someone without known diabetes, a glucose value between 100 and 125 mg/dL is consistent with prediabetes and should be confirmed with a follow-up test. .   12/09/2018  97 65 - 99 mg/dL Final    Comment:    .            Fasting reference interval .     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  Family History  Problem Relation Age of Onset   Hypertension Mother        borderline   Hypothyroidism Mother    Hypertension Father        borderline   Hypothyroidism Sister    Hypertension Maternal Grandmother    Alzheimer's disease Maternal Grandmother    Alzheimer's disease Maternal Grandfather     Social History   Socioeconomic History   Marital status: Single    Spouse name: Not on file   Number of children: Not on file   Years of education: Not on file   Highest education level: Not on file  Occupational History   Not on file  Tobacco Use   Smoking status: Never   Smokeless tobacco: Never  Vaping Use   Vaping Use: Never used  Substance and Sexual Activity   Alcohol use: No    Alcohol/week: 0.0 standard drinks of alcohol   Drug use: No   Sexual activity: Never  Other Topics Concern   Not on file  Social History Narrative   Not on file   Social Determinants of Health   Financial Resource Strain: Low Risk  (03/10/2020)   Overall Financial Resource Strain (CARDIA)    Difficulty of Paying Living Expenses: Not hard at all  Food Insecurity: No Food Insecurity (03/10/2020)   Hunger Vital Sign    Worried About Running Out of Food in the Last Year: Never true    Ran Out of Food in the Last Year: Never true  Transportation Needs: No Transportation Needs (03/10/2020)   PRAPARE - Hydrologist (Medical): No    Lack of Transportation (Non-Medical): No   Physical Activity: Inactive (03/10/2020)   Exercise Vital Sign    Days of Exercise per Week: 0 days    Minutes of Exercise per Session: 0 min  Stress: No Stress Concern Present (03/10/2020)  [employer] - Occupational Stress Questionnaire    Feeling of Stress : Not at all  Social Connections: Moderately Isolated (03/10/2020)   Social Connection and Isolation Panel [NHANES]    Frequency of Communication with Friends and Family: More than three times a week    Frequency of Social Gatherings with Friends and Family: More than three times a week    Attends Religious Services: More than 4 times per year    Active Member of [employer] or Organizations: No    Attends Archivist Meetings: Never    Marital Status: Never married  Intimate Partner Violence: Not At Risk (03/10/2020)   Humiliation, Afraid, Rape, and Kick questionnaire    Fear of Current or Ex-Partner: No    Emotionally Abused: No    Physically Abused: No    Sexually Abused: No     Current Outpatient Medications:    albuterol (VENTOLIN HFA) 108 (90 Base) MCG/ACT inhaler, Inhale 2 puffs into the lungs every 6 (six) hours as needed for wheezing or shortness of breath., Disp: 8 g, Rfl: 0   cetirizine (ZYRTEC) 10 MG tablet, Take 10 mg by mouth daily., Disp: , Rfl:    loperamide (IMODIUM) 2 MG capsule, Take 1 capsule (2 mg total) by mouth 4 (four) times daily as needed for diarrhea or loose stools., Disp: 20 capsule, Rfl: 0   LORazepam (ATIVAN) 0.5 MG tablet, Take 0.5-1 tablets (0.25-0.5 mg total) by mouth once as needed for up to 2 doses for anxiety., Disp: 2 tablet, Rfl: 0   ondansetron (ZOFRAN) 4 MG tablet, Take 1 tablet (4 mg total) by mouth every 8 (eight) hours as needed for refractory nausea / vomiting., Disp: 20 tablet, Rfl: 0   ondansetron (ZOFRAN-ODT) 8 MG disintegrating tablet, Take 1 tablet (8 mg total) by mouth every 8 (eight) hours as needed for nausea or vomiting., Disp: 20 tablet, Rfl:  0   predniSONE (DELTASONE) 20 MG tablet, Take 2 tablets (40 mg total) by mouth daily with breakfast for 5 days., Disp: 10 tablet, Rfl: 0   promethazine (PHENERGAN) 12.5 MG tablet, Take 1 tablet (12.5 mg total) by mouth every 6 (six) hours as needed for nausea or vomiting., Disp: 20 tablet, Rfl: 0   promethazine-dextromethorphan (PROMETHAZINE-DM) 6.25-15 MG/5ML syrup, Take 2.5-5 mLs by mouth 4 (four) times daily as needed for cough., Disp: 118 mL, Rfl: 0   SYNTHROID 100 MCG tablet, TAKE 1 TABLETS ONCE DAILY MON/WED/FRI/SAT TAKE 1 AND 1/2 TABLETS ON SUN/TUES/THURS, Disp: 102 tablet, Rfl: 1  No Known Allergies   ROS  ***  Objective  There were no vitals filed for this visit.  There is no height or weight on file to calculate BMI.  Physical Exam ***  No results found for this or any previous visit (from the past 2160 hour(s)).   Fall Risk:    02/20/2022   10:44 AM 11/20/2021   12:37 PM 09/29/2021   11:17 AM 06/29/2021    9:50 AM 06/15/2021    2:07 PM  Fall Risk   Falls in the past year? 0 0 0 0 0  Number falls in past yr: 0  0 0   Injury with Fall? 0  0 0   Risk for fall due to : No Fall Risks No Fall Risks No Fall Risks    Follow up Falls prevention discussed;Education provided Falls prevention discussed Falls prevention discussed Falls evaluation completed Falls prevention discussed     Functional Status Survey:  Assessment & Plan  1. Well adult exam ***   -USPSTF grade A and B recommendations reviewed with patient; age-appropriate recommendations, preventive care, screening tests, etc discussed and encouraged; healthy living encouraged; see AVS for patient education given to patient -Discussed importance of 150 minutes of physical activity weekly, eat two servings of fish weekly, eat one serving of tree nuts ( cashews, pistachios, pecans, almonds.Marland Kitchen) every other day, eat 6 servings of fruit/vegetables daily and drink plenty of water and avoid sweet beverages.    -Reviewed Health Maintenance: Yes.

## 2022-02-26 ENCOUNTER — Encounter: Payer: BC Managed Care – PPO | Admitting: Family Medicine

## 2022-02-26 DIAGNOSIS — Z124 Encounter for screening for malignant neoplasm of cervix: Secondary | ICD-10-CM

## 2022-02-26 DIAGNOSIS — Z113 Encounter for screening for infections with a predominantly sexual mode of transmission: Secondary | ICD-10-CM

## 2022-02-26 DIAGNOSIS — Z0000 Encounter for general adult medical examination without abnormal findings: Secondary | ICD-10-CM

## 2022-03-19 ENCOUNTER — Other Ambulatory Visit: Payer: Self-pay | Admitting: Family Medicine

## 2022-03-19 DIAGNOSIS — J209 Acute bronchitis, unspecified: Secondary | ICD-10-CM

## 2022-04-02 ENCOUNTER — Telehealth (INDEPENDENT_AMBULATORY_CARE_PROVIDER_SITE_OTHER): Payer: BC Managed Care – PPO | Admitting: Family Medicine

## 2022-04-02 DIAGNOSIS — R197 Diarrhea, unspecified: Secondary | ICD-10-CM

## 2022-04-02 DIAGNOSIS — R1110 Vomiting, unspecified: Secondary | ICD-10-CM | POA: Diagnosis not present

## 2022-04-02 DIAGNOSIS — K529 Noninfective gastroenteritis and colitis, unspecified: Secondary | ICD-10-CM

## 2022-04-02 DIAGNOSIS — R109 Unspecified abdominal pain: Secondary | ICD-10-CM

## 2022-04-02 MED ORDER — DICYCLOMINE HCL 20 MG PO TABS: 20.0000 mg | ORAL_TABLET | Freq: Three times a day (TID) | ORAL | 1 refills | Status: DC | PRN

## 2022-04-02 MED ORDER — FAMOTIDINE 20 MG PO TABS: 20.0000 mg | ORAL_TABLET | Freq: Two times a day (BID) | ORAL | 1 refills | Status: DC | PRN

## 2022-04-02 MED ORDER — ONDANSETRON HCL 4 MG PO TABS: 4.0000 mg | ORAL_TABLET | Freq: Three times a day (TID) | ORAL | 1 refills | Status: DC | PRN

## 2022-04-02 NOTE — Progress Notes (Signed)
Name: Alexandra Crane   MRN: [id number]    DOB: 06/07/1995   Date:04/02/2022       Progress Note  Subjective:    Chief Complaint  Chief Complaint  Patient presents with   Chills    Has not done COVID testing at home, sx since Friday.   Headache   Fatigue   Nausea    I connected with  Alexandra Crane  on 04/02/22 at 11:40 AM EST by a video enabled telemedicine application and verified that I am speaking with the correct person using two identifiers.  I discussed the limitations of evaluation and management by telemedicine and the availability of in person appointments. The patient expressed understanding and agreed to proceed. Staff also discussed with the patient that there may be a patient responsible charge related to this service. 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   Prediabetes 07/09/2016   Family planning 11/22/2014   Flexural eczema 11/22/2014   Hypothyroidism, 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   Obesity (BMI 35.0-39.9 without comorbidity) 11/22/2014    Social History   Tobacco Use   Smoking status: Never   Smokeless tobacco: Never  Substance Use Topics   Alcohol use: No    Alcohol/week: 0.0 standard drinks of alcohol     Current Outpatient Medications:     albuterol (VENTOLIN HFA) 108 (90 Base) MCG/ACT inhaler, Inhale 2 puffs into the lungs every 6 (six) hours as needed for wheezing or shortness of breath., Disp: 8 g, Rfl: 0   cetirizine (ZYRTEC) 10 MG tablet, Take 10 mg by mouth daily., Disp: , Rfl:    loperamide (IMODIUM) 2 MG capsule, Take 1 capsule (2 mg total) by mouth 4 (four) times daily as needed for diarrhea or loose stools., Disp: 20 capsule, Rfl: 0   LORazepam (ATIVAN) 0.5 MG tablet, Take 0.5-1 tablets (0.25-0.5 mg total) by mouth once as needed for up to 2 doses for anxiety., Disp: 2 tablet, Rfl: 0   ondansetron (ZOFRAN) 4 MG tablet, Take 1 tablet (4 mg total) by mouth every 8 (eight) hours as needed for refractory nausea / vomiting., Disp: 20 tablet, Rfl: 0   ondansetron (ZOFRAN-ODT) 8 MG disintegrating tablet, Take 1 tablet (8 mg total) by mouth every 8 (eight) hours as needed for nausea or vomiting., Disp: 20 tablet, Rfl: 0   promethazine (PHENERGAN) 12.5 MG tablet, Take 1 tablet (12.5 mg total) by mouth every 6 (six) hours as needed for nausea or vomiting., Disp: 20 tablet, Rfl: 0   promethazine-dextromethorphan (PROMETHAZINE-DM) 6.25-15 MG/5ML syrup, Take 2.5-5 mLs by mouth 4 (four) times daily as needed for cough., Disp: 118 mL, Rfl: 0   SYNTHROID 100 MCG tablet, TAKE 1 TABLETS ONCE DAILY MON/WED/FRI/SAT TAKE 1 AND 1/2 TABLETS ON SUN/TUES/THURS, Disp: 102 tablet, Rfl: 1  No Known Allergies  I personally reviewed active problem list, medication list, allergies, family history, social history, health maintenance, notes from last encounter, lab results, imaging with the patient/caregiver today.   Review of Systems  Constitutional: Negative.   HENT: Negative.    Eyes: Negative.   Respiratory: Negative.    Cardiovascular: Negative.   Gastrointestinal: Negative.   Endocrine: Negative.   Genitourinary: Negative.   Musculoskeletal: Negative.   Skin: Negative.   Allergic/Immunologic: Negative.   Neurological:  Positive for  headaches.  Hematological: Negative.   Psychiatric/Behavioral: Negative.    All other systems reviewed and are negative.     Objective:   Virtual encounter, vitals limited, only able to obtain the following There were no vitals filed for this visit. There is no height or weight on file to calculate BMI. Nursing Note and Vital Signs reviewed.  Physical Exam Vitals reviewed.  Constitutional:      General: She is not in acute distress.    Appearance: She is well-developed. She is obese. She is not ill-appearing, toxic-appearing or diaphoretic.  HENT:     Head: Normocephalic and atraumatic.  Skin:    Coloration: Skin is not cyanotic or pale.     Findings: No erythema or rash.  Neurological:     Mental Status: She is alert.  Psychiatric:        Mood and Affect: Mood normal.     PE limited by virtual encounter  No results found for this or any previous visit (from the past 72 hour(s)).  Assessment and Plan:     ICD-10-CM   1. Vomiting and diarrhea  R11.10 famotidine (PEPCID) 20 MG tablet   R19.7 ondansetron (ZOFRAN) 4 MG tablet   pushing clear fluids - salt/electrolytes/sugars and antiemetics as needed, can still try zofran and pepcid, slow advancement of diet    2. Abdominal cramping  R10.9 dicyclomine (BENTYL) 20 MG tablet   she denies pain but having cramping, bentyl prn    3. Gastroenteritis  K52.9 famotidine (PEPCID) 20 MG tablet    ondansetron (ZOFRAN) 4 MG tablet    dicyclomine (BENTYL) 20 MG tablet   likely a viral gastroenteritis, starting to improve, no vomiting, diarrhea slowing some, push fluids, manage sx GI upset/cramping      Work note sent through [employer] Advised her to test for COVID at home just to be sure its not covid If neg she can return to work after 24 hours fever free, vomiting free and diarrhea less than 3 in 24 hours    -Red flags and when to present for emergency care or RTC including fever >101.52F, chest pain, shortness of breath,  new/worsening/un-resolving symptoms, reviewed with patient at time of visit. Follow up and care instructions discussed and provided in AVS. - I discussed the assessment and treatment plan with the patient. The patient was provided an opportunity to ask questions and all were answered. The patient agreed with the plan and demonstrated an understanding of the instructions.  I provided 21+ minutes of non-face-to-face time during this encounter.  Delsa Grana, PA-C 04/02/22 11:46 AM

## 2022-04-07 ENCOUNTER — Other Ambulatory Visit: Payer: Self-pay | Admitting: Family Medicine

## 2022-04-07 DIAGNOSIS — E038 Other specified hypothyroidism: Secondary | ICD-10-CM

## 2022-04-24 ENCOUNTER — Other Ambulatory Visit: Payer: Self-pay | Admitting: Family Medicine

## 2022-04-24 DIAGNOSIS — R1110 Vomiting, unspecified: Secondary | ICD-10-CM

## 2022-04-24 DIAGNOSIS — K529 Noninfective gastroenteritis and colitis, unspecified: Secondary | ICD-10-CM

## 2022-07-19 ENCOUNTER — Other Ambulatory Visit: Payer: Self-pay | Admitting: Family Medicine

## 2022-07-19 DIAGNOSIS — E038 Other specified hypothyroidism: Secondary | ICD-10-CM

## 2022-07-19 NOTE — Telephone Encounter (Signed)
Needs an in-person appt. Last 3 were video visits. Needs TSH done, last one was Feb 2023.

## 2022-07-19 NOTE — Telephone Encounter (Signed)
Spoke with pt and appt sch'd with Alexandra Crane

## 2022-07-23 ENCOUNTER — Ambulatory Visit: Payer: BC Managed Care – PPO | Admitting: Physician Assistant

## 2022-07-23 ENCOUNTER — Other Ambulatory Visit: Payer: Self-pay

## 2022-07-23 ENCOUNTER — Encounter: Payer: Self-pay | Admitting: Physician Assistant

## 2022-07-23 VITALS — BP 124/72 | HR 100 | Temp 97.9°F | Resp 16 | Ht 61.0 in | Wt 221.5 lb

## 2022-07-23 DIAGNOSIS — E038 Other specified hypothyroidism: Secondary | ICD-10-CM

## 2022-07-23 DIAGNOSIS — E669 Obesity, unspecified: Secondary | ICD-10-CM | POA: Diagnosis not present

## 2022-07-23 DIAGNOSIS — R7303 Prediabetes: Secondary | ICD-10-CM

## 2022-07-23 NOTE — Progress Notes (Signed)
Office Visit   Patient: Alexandra Crane   DOB: 1995-08-19   27 y.o. Female  MRN: [id number] Visit Date: 07/23/2022  Today's healthcare provider: Dani Gobble Sanuel Ladnier, PA-C  Introduced myself to the patient as a Journalist, newspaper and provided education on APPs in clinical practice.    Chief Complaint  Patient presents with   Hypothyroidism    Follow up, medication refill   Subjective    HPI HPI     Hypothyroidism    Additional comments: Follow up, medication refill      Last edited by Hollie Salk, [place] on 07/23/2022 11:11 AM.      She reports some dizziness over the past few days She states she has a mild cough but denies any other URI symptoms  She reports she feels like she is pitching to the side and this becomes worse with sudden position changes     HYPOTHYROIDISM Thyroid control status:stable Satisfied with current treatment? yes Medication side effects: no Medication compliance: excellent compliance Etiology of hypothyroidism:  Recent dose adjustment:no Fatigue: no Cold intolerance: yes- reports she is always cold  Heat intolerance: no Weight gain: yes Weight loss: no Constipation: no Diarrhea/loose stools: no Palpitations: no Lower extremity edema: no Anxiety/depressed mood: no      Medications: Outpatient Medications Prior to Visit  Medication Sig   albuterol (VENTOLIN HFA) 108 (90 Base) MCG/ACT inhaler Inhale 2 puffs into the lungs every 6 (six) hours as needed for wheezing or shortness of breath.   cetirizine (ZYRTEC) 10 MG tablet Take 10 mg by mouth daily.   dicyclomine (BENTYL) 20 MG tablet Take 1 tablet (20 mg total) by mouth 3 (three) times daily as needed for spasms (abd cramping).   famotidine (PEPCID) 20 MG tablet Take 1 tablet (20 mg total) by mouth 2 (two) times daily as needed for heartburn or indigestion (nausea).   ondansetron (ZOFRAN) 4 MG tablet Take 1 tablet (4 mg total) by mouth every 8 (eight) hours as needed for nausea or vomiting.    SYNTHROID 100 MCG tablet TAKE 1 TABLETS ONCE DAILY MON/WED/FRI/SAT TAKE 1 AND 1/2 TABLETS ON SUN/TUES/THURS   No facility-administered medications prior to visit.    Review of Systems  HENT:  Negative for congestion, ear pain and rhinorrhea.   Neurological:  Positive for dizziness and headaches. Negative for tremors, weakness, light-headedness and numbness.    Last CBC Lab Results  Component Value Date   WBC 7.4 06/15/2021   HGB 13.7 06/15/2021   HCT 42.1 06/15/2021   MCV 81.4 06/15/2021   MCH 26.5 (L) 06/15/2021   RDW 12.0 06/15/2021   PLT 408 (H) [id number]   Last metabolic panel Lab Results  Component Value Date   GLUCOSE 129 (H) 06/15/2021   NA 138 06/15/2021   K 4.0 06/15/2021   CL 104 06/15/2021   CO2 25 06/15/2021   BUN 9 06/15/2021   CREATININE 0.81 06/15/2021   EGFR 105 06/15/2021   CALCIUM 9.5 06/15/2021   PROT 7.1 06/15/2021   ALBUMIN 4.4 08/07/2016   BILITOT 0.2 06/15/2021   ALKPHOS 101 08/07/2016   AST 18 06/15/2021   ALT 21 06/15/2021   ANIONGAP 6 (L) 04/22/2013   Last lipids Lab Results  Component Value Date   CHOL 158 06/15/2021   HDL 47 (L) 06/15/2021   LDLCALC 86 06/15/2021   TRIG 155 (H) 06/15/2021   CHOLHDL 3.4 06/15/2021   Last hemoglobin A1c Lab Results  Component  Value Date   HGBA1C 5.6 06/15/2021   Last thyroid functions Lab Results  Component Value Date   TSH 1.95 06/15/2021       Objective    BP 124/72   Pulse 100   Temp 97.9 F (36.6 C) (Oral)   Resp 16   Ht '5\' 1"'$  (1.549 m)   Wt 221 lb 8 oz (100.5 kg)   SpO2 98%   BMI 41.85 kg/m    Physical Exam Vitals reviewed.  Constitutional:      General: She is awake.     Appearance: Normal appearance. She is well-developed and well-groomed.  HENT:     Head: Normocephalic and atraumatic.     Right Ear: Hearing and ear canal normal. A middle ear effusion is present.     Left Ear: Hearing and ear canal normal. A middle ear effusion is present.     Mouth/Throat:      Lips: Pink.     Mouth: Mucous membranes are moist.     Pharynx: Oropharynx is clear. Uvula midline. No oropharyngeal exudate or posterior oropharyngeal erythema.  Eyes:     General: Lids are normal. Gaze aligned appropriately.     Extraocular Movements: Extraocular movements intact.     Conjunctiva/sclera: Conjunctivae normal.     Pupils: Pupils are equal, round, and reactive to light.  Neck:     Thyroid: No thyroid mass, thyromegaly or thyroid tenderness.  Cardiovascular:     Rate and Rhythm: Normal rate and regular rhythm.     Pulses: Normal pulses.          Radial pulses are 2+ on the right side and 2+ on the left side.     Heart sounds: Normal heart sounds. No murmur heard.    No friction rub. No gallop.  Pulmonary:     Effort: Pulmonary effort is normal.     Breath sounds: Normal breath sounds. No decreased air movement. No decreased breath sounds, wheezing, rhonchi or rales.  Musculoskeletal:     Cervical back: Normal range of motion and neck supple.  Skin:    General: Skin is warm.  Neurological:     General: No focal deficit present.     Mental Status: She is alert and oriented to person, place, and time. Mental status is at baseline.  Psychiatric:        Mood and Affect: Mood normal.        Behavior: Behavior normal. Behavior is cooperative.        Thought Content: Thought content normal.        Judgment: Judgment normal.       No results found for any visits on 07/23/22.  Assessment & Plan      No follow-ups on file.      Problem List Items Addressed This Visit       Endocrine   Hypothyroidism, juvenile - Primary (Chronic)    Chronic, historic concern, ongoing Reports condition appears stable at this time  She is taking Synthroid 100 mcg on Mon/Wed/Fri/sat and 150 mcg on Tue/Thur/Sun  She reports good compliance Will recheck Thyroid panel today along with CMP, CBC for monitoring Results to dictate further management Follow up in 6 months pending labs  results        Relevant Orders   Thyroid Panel With TSH   COMPLETE METABOLIC PANEL WITH GFR     Other   Prediabetes (Chronic)    Chronic, ongoing Most recent A1c was 5.6 last year Recheck A1c  today, results to dictate further management Follow up in 6 months for monitoring        Relevant Orders   HgB A1c   Obesity (BMI 35.0-39.9 without comorbidity)    Chronic, historic condition Will check thyroid panel, Lipid profile and A1c today for monitoring Recommend she continue current medications Recommend diet and exercise efforts to assist with staying active and healthy Follow up as needed for concerns       Relevant Orders   Lipid Profile   COMPLETE METABOLIC PANEL WITH GFR   CBC w/Diff/Platelet     No follow-ups on file.   I, Ralpheal Zappone E Doxie Augenstein, PA-C, have reviewed all documentation for this visit. The documentation on 07/23/22 for the exam, diagnosis, procedures, and orders are all accurate and complete.   Talitha Givens, MHS, PA-C [facility]

## 2022-07-23 NOTE — Patient Instructions (Signed)
I recommend starting your Zyrtec and adding Flonase to help with the dizziness You should also stay well hydrated and make your position changes slowly

## 2022-07-23 NOTE — Assessment & Plan Note (Signed)
Chronic, historic condition Will check thyroid panel, Lipid profile and A1c today for monitoring Recommend she continue current medications Recommend diet and exercise efforts to assist with staying active and healthy Follow up as needed for concerns

## 2022-07-23 NOTE — Assessment & Plan Note (Signed)
Chronic, historic concern, ongoing Reports condition appears stable at this time  She is taking Synthroid 100 mcg on Mon/Wed/Fri/sat and 150 mcg on Tue/Thur/Sun  She reports good compliance Will recheck Thyroid panel today along with CMP, CBC for monitoring Results to dictate further management Follow up in 6 months pending labs results

## 2022-07-23 NOTE — Assessment & Plan Note (Signed)
Chronic, ongoing Most recent A1c was 5.6 last year Recheck A1c today, results to dictate further management Follow up in 6 months for monitoring

## 2022-07-24 LAB — COMPLETE METABOLIC PANEL WITH GFR
AG Ratio: 1.4 (calc) (ref 1.0–2.5)
ALT: 22 U/L (ref 6–29)
AST: 17 U/L (ref 10–30)
Albumin: 4.3 g/dL (ref 3.6–5.1)
Alkaline phosphatase (APISO): 75 U/L (ref 31–125)
BUN: 11 mg/dL (ref 7–25)
CO2: 24 mmol/L (ref 20–32)
Calcium: 9.2 mg/dL (ref 8.6–10.2)
Chloride: 105 mmol/L (ref 98–110)
Creat: 0.72 mg/dL (ref 0.50–0.96)
Globulin: 3 g/dL (calc) (ref 1.9–3.7)
Glucose, Bld: 120 mg/dL — ABNORMAL HIGH (ref 65–99)
Potassium: 4 mmol/L (ref 3.5–5.3)
Sodium: 136 mmol/L (ref 135–146)
Total Bilirubin: 0.4 mg/dL (ref 0.2–1.2)
Total Protein: 7.3 g/dL (ref 6.1–8.1)
eGFR: 119 mL/min/{1.73_m2} (ref >=60)

## 2022-07-24 LAB — CBC WITH DIFFERENTIAL/PLATELET
Absolute Monocytes: 472 cells/uL (ref 200–950)
Basophils Absolute: 36 cells/uL (ref 0–200)
Basophils Relative: 0.4 %
Eosinophils Absolute: 53 cells/uL (ref 15–500)
Eosinophils Relative: 0.6 %
HCT: 43 % (ref 35.0–45.0)
Hemoglobin: 14.4 g/dL (ref 11.7–15.5)
Lymphs Abs: 1691 cells/uL (ref 850–3900)
MCH: 26.9 pg — ABNORMAL LOW (ref 27.0–33.0)
MCHC: 33.5 g/dL (ref 32.0–36.0)
MCV: 80.4 fL (ref 80.0–100.0)
MPV: 9.6 fL (ref 7.5–12.5)
Monocytes Relative: 5.3 %
Neutro Abs: 6648 cells/uL (ref 1500–7800)
Neutrophils Relative %: 74.7 %
Platelets: 415 10*3/uL — ABNORMAL HIGH (ref 140–400)
RBC: 5.35 10*6/uL — ABNORMAL HIGH (ref 3.80–5.10)
RDW: 11.8 % (ref 11.0–15.0)
Total Lymphocyte: 19 %
WBC: 8.9 10*3/uL (ref 3.8–10.8)

## 2022-07-24 LAB — THYROID PANEL WITH TSH
Free Thyroxine Index: 2.8 (ref 1.4–3.8)
T3 Uptake: 30 % (ref 22–35)
T4, Total: 9.4 ug/dL (ref 5.1–11.9)
TSH: 3.63 mIU/L

## 2022-07-24 LAB — HEMOGLOBIN A1C
Hgb A1c MFr Bld: 5.8 % of total Hgb — ABNORMAL HIGH (ref <5.7)
Mean Plasma Glucose: 120 mg/dL
eAG (mmol/L): 6.6 mmol/L

## 2022-07-24 LAB — LIPID PANEL
Cholesterol: 138 mg/dL (ref <200)
HDL: 43 mg/dL — ABNORMAL LOW (ref >=50)
LDL Cholesterol (Calc): 77 mg/dL (calc)
Non-HDL Cholesterol (Calc): 95 mg/dL (calc) (ref <130)
Total CHOL/HDL Ratio: 3.2 (calc) (ref <5.0)
Triglycerides: 101 mg/dL (ref <150)

## 2022-07-24 NOTE — Progress Notes (Signed)
Overall labs look normal and stable Cholesterol looks great Thyroid function appears stable and in goal range  Electrolytes, liver and kidney function are in normal ranges  CBC does not show evidence of anemia or elevated white counts Your A1c is elevated and is in the prediabetic range. At this time, medications are not indicated but I recommend implementing diet and exercise changes to prevent progression to diabetes. This includes reducing dietary intake of sugars and carbohydrates and exercising with at least 150 minutes of moderate intensity physical activity per week. Please let us know if you have further questions or concerns.

## 2022-08-20 ENCOUNTER — Ambulatory Visit: Payer: BC Managed Care – PPO | Admitting: Physician Assistant

## 2022-08-20 ENCOUNTER — Encounter: Payer: Self-pay | Admitting: Physician Assistant

## 2022-08-20 VITALS — BP 126/76 | HR 92 | Temp 97.9°F | Resp 18 | Ht 61.0 in | Wt 218.4 lb

## 2022-08-20 DIAGNOSIS — R1110 Vomiting, unspecified: Secondary | ICD-10-CM

## 2022-08-20 DIAGNOSIS — R051 Acute cough: Secondary | ICD-10-CM | POA: Diagnosis not present

## 2022-08-20 DIAGNOSIS — J029 Acute pharyngitis, unspecified: Secondary | ICD-10-CM

## 2022-08-20 DIAGNOSIS — J3089 Other allergic rhinitis: Secondary | ICD-10-CM

## 2022-08-20 DIAGNOSIS — J209 Acute bronchitis, unspecified: Secondary | ICD-10-CM

## 2022-08-20 LAB — POCT RAPID STREP A (OFFICE): Rapid Strep A Screen: NEGATIVE

## 2022-08-20 LAB — POCT INFLUENZA A/B
Influenza A, POC: NEGATIVE
Influenza B, POC: NEGATIVE

## 2022-08-20 MED ORDER — PROMETHAZINE-DM 6.25-15 MG/5ML PO SYRP: 5.0000 mL | ORAL_SOLUTION | Freq: Four times a day (QID) | ORAL | 0 refills | Status: DC | PRN

## 2022-08-20 MED ORDER — ALBUTEROL SULFATE HFA 108 (90 BASE) MCG/ACT IN AERS: 2.0000 | INHALATION_SPRAY | Freq: Four times a day (QID) | RESPIRATORY_TRACT | 3 refills | Status: DC | PRN

## 2022-08-20 NOTE — Patient Instructions (Addendum)
  I also recommend adding an antihistamine to your daily regimen This includes medications like Claritin, Allegra, Zyrtec- the generics of these work very well and are usually less expensive I recommend using Flonase nasal spray - 2 puffs twice per day to help with your nasal congestion The antihistamines and Flonase can take a few weeks to provide significant relief from allergy symptoms but should start to provide some benefit soon.  You can add Mucinex to this to help thin out the mucus and make it easier to get out. Make sure you are drinking plenty of water to help with this   During the day you can use Robitussin to help with your cough symptoms   It was nice to meet you and I appreciate the opportunity to be involved in your care If you were satisfied with the care you received from me, I would greatly appreciate you saying so in the after-visit survey that is sent out following our visit.

## 2022-08-20 NOTE — Progress Notes (Unsigned)
Acute Office Visit   Patient: Alexandra Crane   DOB: 1995/06/26   27 y.o. Female  MRN: [id number] Visit Date: 08/20/2022  Today's healthcare provider: Oswaldo Conroy Jullian Clayson, PA-C  Introduced myself to the patient as a Secondary school teacher and provided education on APPs in clinical practice.    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   famotidine (PEPCID) 20 MG tablet Take 1 tablet (20 mg total) by mouth 2 (two) times daily as needed for heartburn or indigestion (nausea).   SYNTHROID 100 MCG tablet TAKE 1 TABLETS ONCE DAILY MON/WED/FRI/SAT TAKE 1 AND 1/2 TABLETS ON SUN/TUES/THURS   [DISCONTINUED] albuterol (VENTOLIN HFA) 108 (90 Base) MCG/ACT inhaler Inhale 2 puffs into the lungs every 6 (six) hours as needed for wheezing or shortness of breath.   dicyclomine (BENTYL) 20 MG tablet Take 1 tablet (20 mg total) by mouth 3 (three) times daily as needed for spasms (abd cramping). (Patient not taking: Reported on 08/20/2022)   ondansetron (ZOFRAN) 4 MG tablet Take 1 tablet (4 mg total) by mouth every 8 (eight) hours as needed for nausea or vomiting. (Patient not taking: Reported on 08/20/2022)   No facility-administered medications prior to visit.    Review of Systems  Constitutional:  Negative for chills, fatigue and fever.   HENT:  Positive for congestion, postnasal drip, sneezing, sore throat and trouble swallowing. Negative for ear pain, rhinorrhea, sinus pressure and sinus pain.   Eyes:  Negative for discharge and itching.  Respiratory:  Positive for cough.   Gastrointestinal:  Positive for vomiting (Thinks this was from mucus build up). Negative for diarrhea.  Musculoskeletal:  Negative for myalgias.  Neurological:  Positive for headaches. Negative for dizziness.       Objective    BP 126/76 (BP Location: Right Arm, Patient Position: Sitting, Cuff Size: Large)   Pulse 92   Temp 97.9 F (36.6 C) (Oral)   Resp 18   Ht 5\' 1"  (1.549 m) Comment: per patient  Wt 218 lb 6.4 oz (99.1 kg)   SpO2 99%   BMI 41.27 kg/m    Physical Exam Vitals reviewed.  Constitutional:      General: She is awake.     Appearance: Normal appearance. She is well-developed.  HENT:     Head: Normocephalic and atraumatic.     Right Ear: Tympanic membrane, ear canal and external ear normal.     Left Ear: Tympanic membrane, ear canal and external ear normal.     Mouth/Throat:     Mouth: Mucous membranes are moist.     Pharynx: No oropharyngeal exudate or posterior oropharyngeal erythema.  Eyes:     Extraocular Movements: Extraocular movements intact.     Conjunctiva/sclera: Conjunctivae normal.  Pupils: Pupils are equal, round, and reactive to light.  Cardiovascular:     Rate and Rhythm: Normal rate and regular rhythm.     Heart sounds: Normal heart sounds.  Pulmonary:     Effort: Pulmonary effort is normal.     Breath sounds: Normal breath sounds. No decreased air movement. No decreased breath sounds, wheezing, rhonchi or rales.  Musculoskeletal:     Cervical back: Normal range of motion and neck supple.  Lymphadenopathy:     Head:     Right side of head: No submental, submandibular or preauricular adenopathy.     Left side of head: No submental, submandibular or preauricular adenopathy.     Cervical: No cervical  adenopathy.  Skin:    General: Skin is warm and dry.  Neurological:     Mental Status: She is alert.  Psychiatric:        Mood and Affect: Mood normal.        Behavior: Behavior normal. Behavior is cooperative.        Thought Content: Thought content normal.        Judgment: Judgment normal.       Results for orders placed or performed in visit on 08/20/22  POCT rapid strep A  Result Value Ref Range   Rapid Strep A Screen Negative Negative  POCT Influenza A/B  Result Value Ref Range   Influenza A, POC Negative Negative   Influenza B, POC Negative Negative    Assessment & Plan      No follow-ups on file.     Problem List Items Addressed This Visit       Respiratory   Allergic rhinitis    Chronic, recurrent Suspect current symptoms are likely the result of allergic rhinitis, could also be this complicated by mild URI Recommend that she restarts her daily antihistamine and adds Flonase for management Will provide refill of Promethazine-DM for cough and associated nausea Given her symptoms and hx will also provide refills of Albuterol inhalers to further assist with cough and breathing as needed Follow up as needed for persistent or progressing symptoms        Relevant Medications   albuterol (VENTOLIN HFA) 108 (90 Base) MCG/ACT inhaler   Other Visit Diagnoses     Sore throat    -  Primary Acute, new concern  Reports ongoing sore throat since Thurs Reviewed negative rapid strep and flu results with her during apt COVID results pending  Suspect current symptoms may be secondary to allergies or URI or even both  Will proceed with symptomatic therapies and recommend adding daily antihistamine and Flonase to assist with allergy symptoms Follow up as needed for persistent or progressing symptoms     Relevant Orders   POCT rapid strep A (Completed)   Novel Coronavirus, NAA ([facility])   POCT Influenza A/B (Completed)   Acute cough       Relevant Medications    promethazine-dextromethorphan (PROMETHAZINE-DM) 6.25-15 MG/5ML syrup   albuterol (VENTOLIN HFA) 108 (90 Base) MCG/ACT inhaler   Other Relevant Orders   POCT rapid strep A (Completed)   Novel Coronavirus, NAA ([facility])   POCT Influenza A/B (Completed)   Vomiting, unspecified vomiting type, unspecified whether nausea present       Relevant Medications   promethazine-dextromethorphan (PROMETHAZINE-DM) 6.25-15 MG/5ML syrup   Other Relevant Orders   POCT rapid strep A (Completed)   Novel Coronavirus, NAA ([facility])   POCT Influenza A/B (Completed)        No follow-ups  on file.   I, Marcella Charlson E Quinntin Malter, PA-C, have reviewed all documentation for this visit. The documentation on 08/21/22 for the exam, diagnosis, procedures, and orders are all accurate and complete.   Jacquelin Hawking, MHS, PA-C [facility]

## 2022-08-21 LAB — SPECIMEN STATUS REPORT

## 2022-08-21 LAB — NOVEL CORONAVIRUS, NAA: SARS-CoV-2, NAA: NOT DETECTED

## 2022-08-21 NOTE — Assessment & Plan Note (Addendum)
Chronic, recurrent Suspect current symptoms are likely the result of allergic rhinitis, could also be this complicated by mild URI Recommend that she restarts her daily antihistamine and adds Flonase for management Will provide refill of Promethazine-DM for cough and associated nausea Given her symptoms and hx will also provide refills of Albuterol inhalers to further assist with cough and breathing as needed Follow up as needed for persistent or progressing symptoms

## 2022-08-22 NOTE — Progress Notes (Signed)
COVID negative.

## 2022-10-08 ENCOUNTER — Other Ambulatory Visit: Payer: Self-pay | Admitting: Family Medicine

## 2022-10-08 DIAGNOSIS — E038 Other specified hypothyroidism: Secondary | ICD-10-CM

## 2022-12-17 ENCOUNTER — Telehealth: Payer: Self-pay | Admitting: Family Medicine

## 2022-12-17 ENCOUNTER — Other Ambulatory Visit: Payer: Self-pay

## 2022-12-17 ENCOUNTER — Other Ambulatory Visit: Payer: Self-pay | Admitting: Family Medicine

## 2022-12-17 ENCOUNTER — Encounter: Payer: Self-pay | Admitting: Nurse Practitioner

## 2022-12-17 ENCOUNTER — Telehealth: Payer: BC Managed Care – PPO | Admitting: Nurse Practitioner

## 2022-12-17 ENCOUNTER — Ambulatory Visit: Payer: Self-pay | Admitting: *Deleted

## 2022-12-17 ENCOUNTER — Other Ambulatory Visit: Payer: Self-pay | Admitting: Nurse Practitioner

## 2022-12-17 DIAGNOSIS — U071 COVID-19: Secondary | ICD-10-CM

## 2022-12-17 MED ORDER — MOLNUPIRAVIR EUA 200MG CAPSULE: 4.0000 | ORAL_CAPSULE | Freq: Two times a day (BID) | ORAL | 0 refills | Status: AC

## 2022-12-17 MED ORDER — PROMETHAZINE-DM 6.25-15 MG/5ML PO SYRP
5.0000 mL | ORAL_SOLUTION | Freq: Four times a day (QID) | ORAL | 0 refills | Status: DC | PRN
Start: 2022-12-17 — End: 2023-01-22

## 2022-12-17 MED ORDER — ONDANSETRON 4 MG PO TBDP
4.0000 mg | ORAL_TABLET | Freq: Three times a day (TID) | ORAL | 0 refills | Status: DC | PRN
Start: 2022-12-17 — End: 2023-01-22

## 2022-12-17 MED ORDER — ONDANSETRON 4 MG PO TBDP: 4.0000 mg | ORAL_TABLET | Freq: Three times a day (TID) | ORAL | 0 refills | Status: DC | PRN

## 2022-12-17 MED ORDER — PROMETHAZINE-DM 6.25-15 MG/5ML PO SYRP
5.0000 mL | ORAL_SOLUTION | Freq: Four times a day (QID) | ORAL | 0 refills | Status: DC | PRN
Start: 2022-12-17 — End: 2022-12-17

## 2022-12-17 NOTE — Telephone Encounter (Signed)
  Chief Complaint: Covid Positive Symptoms: Cough, body aches, sore throat,fatigue, congestion Frequency: Wednesday evening Pertinent Negatives: Patient denies SOB Disposition: [] ED /[] Urgent Care (no appt availability in office) / [x] Appointment(In office/virtual)/ []  [facility]/ [] Home Care/ [] Refused Recommended Disposition /[] [place] [facility]/ []  Follow-up with PCP Additional Notes: Has tried OTC meds, ineffective. Had subjective fever last night "Broke." Care advise provided, self isolation guidelines reviewed, pt verbalizes understanding. VV secured for today. Reason for Disposition  [1] Continuous (nonstop) coughing interferes with work or school AND [2] no improvement using cough treatment per Care Advice  Answer Assessment - Initial Assessment Questions 1. COVID-19 DIAGNOSIS: "How do you know that you have COVID?" (e.g., positive lab test or self-test, diagnosed by doctor or NP/PA, symptoms after exposure).     Home, Thursday 2. COVID-19 EXPOSURE: "Was there any known exposure to COVID before the symptoms began?" CDC Definition of close contact: within 6 feet (2 meters) for a total of 15 minutes or more over a 24-hour period.      Yes 3. ONSET: "When did the COVID-19 symptoms start?"      Wednesday night 4. WORST SYMPTOM: "What is your worst symptom?" (e.g., cough, fever, shortness of breath, muscle aches)     NA 5. COUGH: "Do you have a cough?" If Yes, ask: "How bad is the cough?"       Productive, clear 6. FEVER: "Do you have a fever?" If Yes, ask: "What is your temperature, how was it measured, and when did it start?"     No, did yesterday 7. RESPIRATORY STATUS: "Describe your breathing?" (e.g., normal; shortness of breath, wheezing, unable to speak)      Mild SOB 8. BETTER-SAME-WORSE: "Are you getting better, staying the same or getting worse compared to yesterday?"  If getting worse, ask, "In what way?"    Same 9. OTHER SYMPTOMS: "Do you have any other  symptoms?"  (e.g., chills, fatigue, headache, loss of smell or taste, muscle pain, sore throat)     Body aches, fatigue, cough, sore throat 10. HIGH RISK DISEASE: "Do you have any chronic medical problems?" (e.g., asthma, heart or lung disease, weak immune system, obesity, etc.)       Na 11. VACCINE: "Have you had the COVID-19 vaccine?" If Yes, ask: "Which one, how many shots, when did you get it?"       2 vaccines in 2020  Protocols used: Coronavirus (COVID-19) Diagnosed or Suspected-A-AH

## 2022-12-17 NOTE — Telephone Encounter (Signed)
Patient called to see it her promethazine-dextromethorphan (PROMETHAZINE-DM) 6.25-15 MG/5ML syrup and ondansetron (ZOFRAN-ODT) 4 MG disintegrating tablet can be [facility]ferred from [facility] [address] [facility] [place] to the  [id number], [place] - [address] Phone: [phone]  Fax: [phone]    Please f/u with patient

## 2022-12-17 NOTE — Progress Notes (Signed)
Name: Alexandra Crane   MRN: [id number]    DOB: 03-May-1996   Date:12/17/2022       Progress Note  Subjective  Chief Complaint  Chief Complaint  Patient presents with   Covid Positive    Cough, congested, chills, headache for 5 days    I connected with  Twyla M Koegel  on 12/17/22 at 11:00 AM EDT by a video enabled telemedicine application and verified that I am speaking with the correct person using two identifiers.  I discussed the limitations of evaluation and management by telemedicine and the availability of in person appointments. The patient expressed understanding and agreed to proceed with a virtual visit  Staff also discussed with the patient that there may be a patient responsible charge related to this service. Patient Location: home Provider Location: [facility] Additional Individuals present: alone  HPI  Covid:  symptoms started thursday -Fever: yes -Cough: yes -Shortness of breath: no -Wheezing: no -Chest congestion: yes -Nasal congestion: yes -Runny nose: yes -Post nasal drip: no -Sore throat: no -Sinus pressure: yes -Headache: yes -Face pain: no -Ear pain:  no -Ear pressure: no -Relief with OTC cold/cough medications: no, she took tylenol and mucinex Recommend taking zyrtec, flonase, mucinex, vitamin d, vitamin c, and zinc. Push fluids and get rest.    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   Obesity (BMI 35.0-39.9 without comorbidity) 11/22/2014    Social History   Tobacco Use   Smoking status: Never   Smokeless tobacco: Never  Substance Use Topics   Alcohol use: No    Alcohol/week: 0.0 standard drinks of alcohol     Current  Outpatient Medications:    albuterol (VENTOLIN HFA) 108 (90 Base) MCG/ACT inhaler, Inhale 2 puffs into the lungs every 6 (six) hours as needed for wheezing or shortness of breath., Disp: 8 g, Rfl: 3   cetirizine (ZYRTEC) 10 MG tablet, Take 10 mg by mouth daily., Disp: , Rfl:    famotidine (PEPCID) 20 MG tablet, Take 1 tablet (20 mg total) by mouth 2 (two) times daily as needed for heartburn or indigestion (nausea)., Disp: 60 tablet, Rfl: 1   promethazine-dextromethorphan (PROMETHAZINE-DM) 6.25-15 MG/5ML syrup, Take 5 mLs by mouth 4 (four) times daily as needed for cough., Disp: 118 mL, Rfl: 0   SYNTHROID 100 MCG tablet, TAKE 1 TABLETS ONCE DAILY MON/WED/FRI/SAT TAKE 1 AND 1/2 TABLETS ON SUN/TUES/THURS, Disp: 102 tablet, Rfl: 0   dicyclomine (BENTYL) 20 MG tablet, Take 1 tablet (20 mg total) by mouth 3 (three) times daily as needed for spasms (abd cramping). (Patient not taking: Reported on 08/20/2022), Disp: 20 tablet, Rfl: 1   ondansetron (ZOFRAN) 4 MG tablet, Take 1 tablet (4 mg total) by mouth every 8 (eight) hours as needed for nausea or vomiting. (Patient not taking: Reported on 08/20/2022), Disp: 20 tablet, Rfl: 1  No Known Allergies  I personally reviewed active problem list, medication list, allergies with the patient/caregiver today.  ROS  Constitutional: positive for fever or weight change.  HEENT: positive for nasal congestion Respiratory: positive for cough and negative for  shortness of breath.   Cardiovascular: Negative for chest pain or palpitations.  Gastrointestinal: Negative for abdominal pain, no bowel changes.  Musculoskeletal: Negative for gait problem or joint swelling.  Skin: Negative for rash.  Neurological: Negative for dizziness or headache.  No other specific complaints in a complete review of systems (except as listed in HPI above).   Objective  Virtual encounter, vitals not obtained.  There is no height or weight on file to calculate BMI.  Nursing Note and  Vital Signs reviewed.  Physical Exam  Awake, alert and oriented, speaking in complete sentences  No results found for this or any previous visit (from the past 72 hour(s)).  Assessment & Plan  1. COVID-19 Recommend taking zyrtec, flonase, mucinex, vitamin d, vitamin c, and zinc. Push fluids and get rest.    Will send in molnipiravir, zofran and promethazine-dextromethorphan  - molnupiravir EUA (LAGEVRIO) 200 mg CAPS capsule; Take 4 capsules (800 mg total) by mouth 2 (two) times daily for 5 days.  Dispense: 40 capsule; Refill: 0 - ondansetron (ZOFRAN-ODT) 4 MG disintegrating tablet; Take 1 tablet (4 mg total) by mouth every 8 (eight) hours as needed for nausea or vomiting.  Dispense: 10 tablet; Refill: 0 - promethazine-dextromethorphan (PROMETHAZINE-DM) 6.25-15 MG/5ML syrup; Take 5 mLs by mouth 4 (four) times daily as needed for cough.  Dispense: 118 mL; Refill: 0    -Red flags and when to present for emergency care or RTC including fever >101.10F, chest pain, shortness of breath, new/worsening/un-resolving symptoms,  reviewed with patient at time of visit. Follow up and care instructions discussed and provided in AVS. - I discussed the assessment and treatment plan with the patient. The patient was provided an opportunity to ask questions and all were answered. The patient agreed with the plan and demonstrated an understanding of the instructions.  I provided 15 minutes of non-face-to-face time during this encounter.  Berniece Salines, FNP

## 2022-12-17 NOTE — Telephone Encounter (Unsigned)
Copied from CRM [id number]. Topic: General - Other >> Dec 17, 2022  3:45 PM Santiya F wrote: Reason for CRM: Pt's mother Alexandra Crane wants to know can the doctor's note be printed out so they can pick it up because she's not seeing it in MyChart. Alexandra Crane says they can pick it up tomorrow 08/06.

## 2022-12-17 NOTE — Telephone Encounter (Signed)
Medication Refill - Medication: olnupiravir EUA (LAGEVRIO) 200 mg CAPS capsule/   promethazine-dextromethorphan (PROMETHAZINE-DM) 6.25-15 MG/5ML/ondansetron (ZOFRAN-ODT) 4 MG disintegrating tablet   Meds just described today, [facility] [place], pt [facility] [place] [facility]  Has the patient contacted their pharmacy? No /this was just prescribed /wants this at a different pharmacy  (Agent: If no, request that the patient contact the pharmacy for the refill. If patient does not wish to contact the pharmacy document the reason why and proceed with request.) (Agent: If yes, when and what did the pharmacy advise?)  Preferred Pharmacy (with phone number or street name):  [id number], [place] - [address] Phone: [phone]  Fax: [phone]     Has the patient been seen for an appointment in the last year OR does the patient have an upcoming appointment? yes  Agent: Please be advised that RX refills may take up to 3 business days. We ask that you follow-up with your pharmacy.

## 2022-12-17 NOTE — Telephone Encounter (Signed)
Patient called stated molnupiravir EUA (LAGEVRIO) 200 mg CAPS capsule the was too expensive and request to have a cheaper medication. Please f/u with patient

## 2022-12-17 NOTE — Telephone Encounter (Signed)
Pt's mother Aggie Cosier is calling in because pt was prescribed molnupiravir EUA (LAGEVRIO) 200 mg CAPS capsule [[id number]] for COVID and it is not covered by pt's insurance. Aggie Cosier says Paxlovid is covered by their insurance though. Aggie Cosier wants to know if Paxlovid can be sent in instead.

## 2022-12-17 NOTE — Telephone Encounter (Signed)
[facility] [place]

## 2022-12-17 NOTE — Telephone Encounter (Signed)
Patient stated her insurance was not enter correctly at pharmacy

## 2022-12-18 ENCOUNTER — Encounter: Payer: Self-pay | Admitting: Nurse Practitioner

## 2022-12-18 NOTE — Telephone Encounter (Signed)
Letter at front desk

## 2022-12-18 NOTE — Telephone Encounter (Signed)
Patient's mother Aggie Cosier called and stated they would be by this morning, 8.6.2024, to pick up patient's work note and stated it needed to have her returning to work on Wednesday.

## 2023-01-11 ENCOUNTER — Other Ambulatory Visit: Payer: Self-pay | Admitting: Internal Medicine

## 2023-01-11 DIAGNOSIS — E038 Other specified hypothyroidism: Secondary | ICD-10-CM

## 2023-01-11 NOTE — Telephone Encounter (Signed)
Requested Prescriptions  Pending Prescriptions Disp Refills   SYNTHROID 100 MCG tablet [Pharmacy Med Name: SYNTHROID 100 MCG TABLET] 102 tablet 1    Sig: TAKE 1 TABLETS ONCE DAILY MON/WED/FRI/SAT TAKE 1 AND 1/2 TABLETS ON SUN/TUES/THURS     Endocrinology:  Hypothyroid Agents Passed - 01/11/2023  1:40 AM      Passed - TSH in normal range and within 360 days    TSH  Date Value Ref Range Status  07/23/2022 3.63 mIU/L Final    Comment:              Reference Range .           > or = 20 Years  0.40-4.50 .                Pregnancy Ranges           First trimester    0.26-2.66           Second trimester   0.55-2.73           Third trimester    0.43-2.91          Passed - Valid encounter within last 12 months    Recent Outpatient Visits           3 weeks ago COVID-19   [facility] Berniece Salines, FNP   4 months ago Sore throat   [facility] Mecum, Oswaldo Conroy, PA-C   5 months ago Hypothyroidism, juvenile    [facility] Mecum, Oswaldo Conroy, PA-C   9 months ago Vomiting and diarrhea   [facility] Danelle Berry, PA-C   10 months ago Acute bronchitis, unspecified organism   [facility] [place] Berry, [place]       Future Appointments             In 1 week Alba Cory, MD [facility], [facility]

## 2023-01-21 NOTE — Progress Notes (Unsigned)
Name: Alexandra Crane   MRN: [id number]    DOB: 06-Nov-1995   Date:01/21/2023       Progress Note  Subjective  Chief Complaint  Follow Up  HPI  Hypothyroidism: diagnosed in her pre-teen years, she has been taking Synthroid 100 mcg daily and an extra hal pill on S, T and T,  and TSH has been within normal limits.  Today she denies constipation or hair loss, she has dry skin    Obesity: she has continue to gain weight over time, Discussed increase in physical activity for heart health   Metabolic Syndrome: increase in abdominal girth, oligomenorrhea, insulin resistance and dyslipidemia, we will recheck labs today.    Pre-diabetes: she is trying to avoid sweets, drinking more water, denies polyphagia, polydipsia or polyuria, Last A1C was normal. 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  Family History  Problem Relation Age of Onset   Hypertension Mother        borderline   Hypothyroidism Mother    Hypertension Father        borderline   Hypothyroidism Sister    Hypertension Maternal Grandmother    Alzheimer's disease Maternal Grandmother    Alzheimer's disease Maternal Grandfather     Social History   Tobacco Use   Smoking status: Never   Smokeless tobacco: Never  Substance Use Topics   Alcohol use: No    Alcohol/week: 0.0 standard drinks of alcohol     Current Outpatient Medications:    albuterol (VENTOLIN HFA) 108 (90 Base) MCG/ACT inhaler, Inhale 2 puffs into the lungs every 6  (six) hours as needed for wheezing or shortness of breath., Disp: 8 g, Rfl: 3   cetirizine (ZYRTEC) 10 MG tablet, Take 10 mg by mouth daily., Disp: , Rfl:    famotidine (PEPCID) 20 MG tablet, Take 1 tablet (20 mg total) by mouth 2 (two) times daily as needed for heartburn or indigestion (nausea)., Disp: 60 tablet, Rfl: 1   ondansetron (ZOFRAN-ODT) 4 MG disintegrating tablet, Take 1 tablet (4 mg total) by mouth every 8 (eight) hours as needed for nausea or vomiting., Disp: 10 tablet, Rfl: 0   promethazine-dextromethorphan (PROMETHAZINE-DM) 6.25-15 MG/5ML syrup, Take 5 mLs by mouth 4 (four) times daily as needed for cough., Disp: 118 mL, Rfl: 0   SYNTHROID 100 MCG tablet, TAKE 1 TABLETS ONCE DAILY MON/WED/FRI/SAT TAKE 1 AND 1/2 TABLETS ON SUN/TUES/THURS, Disp: 102 tablet, Rfl: 1  No Known Allergies  I personally reviewed active problem list, medication list, allergies, family history, social history, health maintenance with the patient/caregiver today.   ROS  ***  Objective  There were no vitals filed for this visit.  There is no height or weight on file to calculate BMI.  Physical Exam ***  No results found for this or any previous visit (from the past 2160 hour(s)).   PHQ2/9:    12/17/2022   10:26 AM 07/23/2022   11:15 AM 04/02/2022    9:19 AM 02/20/2022   10:44 AM  11/20/2021   12:37 PM  Depression screen PHQ 2/9  Decreased Interest 0 0 0 0 0  Down, Depressed, Hopeless 0 0 0 0 0  PHQ - 2 Score 0 0 0 0 0  Altered sleeping   0 0 0  Tired, decreased energy   0 0 0  Change in appetite   0 0 0  Feeling bad or failure about yourself    0 0 0  Trouble concentrating   0 0 0  Moving slowly or fidgety/restless   0 0 0  Suicidal thoughts   0 0 0  PHQ-9 Score   0 0 0  Difficult doing work/chores   Not difficult at all Not difficult at all     phq 9 is {gen pos [id number]}   Fall Risk:    12/17/2022   10:25 AM 08/20/2022   11:44 AM 07/23/2022   11:15 AM 04/02/2022    9:19 AM  02/20/2022   10:44 AM  Fall Risk   Falls in the past year? 0 0 0 0 0  Number falls in past yr: 0  0 0 0  Injury with Fall? 0  0 0 0  Risk for fall due to :  No Fall Risks  No Fall Risks No Fall Risks  Follow up  Falls prevention discussed;Education provided;Falls evaluation completed  Falls prevention discussed;Education provided;Falls evaluation completed Falls prevention discussed;Education provided      Functional Status Survey:      Assessment & Plan  *** There are no diagnoses linked to this encounter.

## 2023-01-22 ENCOUNTER — Other Ambulatory Visit (HOSPITAL_COMMUNITY)
Admission: RE | Admit: 2023-01-22 | Discharge: 2023-01-22 | Disposition: A | Discharge status: Home / Self Care | Payer: BC Managed Care – PPO | Source: Ambulatory Visit | Attending: Family Medicine | Admitting: Family Medicine

## 2023-01-22 ENCOUNTER — Encounter: Payer: Self-pay | Admitting: Family Medicine

## 2023-01-22 ENCOUNTER — Ambulatory Visit: Payer: BC Managed Care – PPO | Admitting: Family Medicine

## 2023-01-22 VITALS — BP 126/72 | HR 93 | Resp 16 | Ht 61.0 in | Wt 215.0 lb

## 2023-01-22 DIAGNOSIS — R7303 Prediabetes: Secondary | ICD-10-CM

## 2023-01-22 DIAGNOSIS — E038 Other specified hypothyroidism: Secondary | ICD-10-CM | POA: Diagnosis not present

## 2023-01-22 DIAGNOSIS — Z113 Encounter for screening for infections with a predominantly sexual mode of transmission: Secondary | ICD-10-CM

## 2023-01-22 DIAGNOSIS — J302 Other seasonal allergic rhinitis: Secondary | ICD-10-CM | POA: Diagnosis not present

## 2023-01-22 DIAGNOSIS — Z6841 Body Mass Index (BMI) 40.0 and over, adult: Secondary | ICD-10-CM | POA: Insufficient documentation

## 2023-01-22 DIAGNOSIS — Z3009 Encounter for other general counseling and advice on contraception: Secondary | ICD-10-CM

## 2023-01-22 LAB — POCT URINE PREGNANCY: Preg Test, Ur: NEGATIVE

## 2023-01-23 LAB — CYTOLOGY, (ORAL, ANAL, URETHRAL) ANCILLARY ONLY
Chlamydia: NEGATIVE
Comment: NEGATIVE
Comment: NORMAL
Neisseria Gonorrhea: NEGATIVE

## 2023-01-31 ENCOUNTER — Ambulatory Visit: Payer: Self-pay | Admitting: *Deleted

## 2023-01-31 ENCOUNTER — Telehealth: Payer: BC Managed Care – PPO | Admitting: Physician Assistant

## 2023-01-31 DIAGNOSIS — B349 Viral infection, unspecified: Secondary | ICD-10-CM

## 2023-01-31 MED ORDER — ONDANSETRON 4 MG PO TBDP: 4.0000 mg | ORAL_TABLET | Freq: Three times a day (TID) | ORAL | 0 refills | Status: DC | PRN

## 2023-01-31 MED ORDER — BENZONATATE 100 MG PO CAPS
100.0000 mg | ORAL_CAPSULE | Freq: Three times a day (TID) | ORAL | 0 refills | Status: DC | PRN
Start: 2023-01-31 — End: 2023-07-24

## 2023-01-31 NOTE — Patient Instructions (Signed)
Alexandra Crane, thank you for joining Alexandra Climes, PA-C for today's virtual visit.  While this provider is not your primary care provider (PCP), if your PCP is located in our provider database this encounter information will be shared with them immediately following your visit.   A [facility] MyChart account gives you access to today's visit and all your visits, tests, and labs performed at [facility] " click here if you don't have a [facility] MyChart account or go to [email or web address]  Consent: (Patient) Alexandra Crane provided verbal consent for this virtual visit at the beginning of the encounter.  Current Medications:  Current Outpatient Medications:    cetirizine (ZYRTEC) 10 MG tablet, Take 10 mg by mouth daily., Disp: , Rfl:    SYNTHROID 100 MCG tablet, TAKE 1 TABLETS ONCE DAILY MON/WED/FRI/SAT TAKE 1 AND 1/2 TABLETS ON SUN/TUES/THURS, Disp: 102 tablet, Rfl: 1   Medications ordered in this encounter:  No orders of the defined types were placed in this encounter.    *If you need refills on other medications prior to your next appointment, please contact your pharmacy*  Follow-Up: Call back or seek an in-person evaluation if the symptoms worsen or if the condition fails to improve as anticipated.   Virtual Care [phone]  Other Instructions Please keep hydrated and rest. Ok to use Mucinex OTC along with your Zyrtec. Tylenol for throat pain, headache. Retest for COVID in 24-48 hours as discussed. Message [place] directly if positive.  Take the zofran as directed for nausea. Keep a bland diet (see below). The Tess[place]on is to further help with cough. If you are unable to keep in fluids you need an in person evaluation ASAP.  Food Choices to Help Relieve Diarrhea, Adult Diarrhea can make you feel weak and cause you to become dehydrated. Dehydration is a condition in which there is not enough water or other fluids in the body. It is important  to choose the right foods and drinks to: Relieve diarrhea. Replace lost fluids and nutrients. Prevent dehydration. What are tips for following this plan? Relieving diarrhea Avoid foods that make your diarrhea worse. These may include: Foods and drinks that are sweetened with high-fructose corn syrup, honey, or sweeteners such as xylitol, sorbitol, and mannitol. Check food labels for these ingredients. Fried, greasy, or spicy foods. Raw fruits and vegetables. Eat foods that are rich in probiotics. These include foods such as yogurt and fermented milk products. Probiotics can help increase healthy bacteria in your stomach and intestines (gastrointestinal or GI tract). This may help digestion and stop diarrhea. If you have lactose intolerance, avoid dairy products. These may make your diarrhea worse. Take medicine to help stop diarrhea only as told by your health care provider. Replacing nutrients  Eat bland, easy-to-digest foods in small amounts as you are able, until your diarrhea starts to get better. These foods include bananas, applesauce, rice, toast, and crackers. Over time, add nutrient-rich foods as your body tolerates them or as told by your health care provider. These include: Well-cooked protein foods, such as eggs, lean meats like fish or chicken without skin, and tofu. Peeled, seeded, and soft-cooked fruits and vegetables. Low-fat dairy products. Whole grains. Take vitamin and mineral supplements as told by your health care provider. Preventing dehydration  Start by sipping water or a solution to prevent dehydration (oral rehydration solution, or ORS). This is a drink that helps replace fluids and minerals your body has lost. You can buy an ORS at  pharmacies and retail stores. Try to drink at least 8-10 cups (2,000-2,500 mL) of fluid each day to help replace lost fluids. If your urine is pale yellow, you are getting enough fluids. You may drink other liquids in addition to water,  such as fruit juice that you have added water to (diluted fruit juice) or low-calorie sports drinks, as tolerated or as told by your health care provider. Avoid drinks with caffeine, such as coffee, tea, or soft drinks. Avoid alcohol. This information is not intended to replace advice given to you by your health care provider. Make sure you discuss any questions you have with your health care provider. Document Revised: 10/17/2021 Document Reviewed: 10/17/2021 Elsevier Patient Education  2024 Elsevier Inc.    If you have been instructed to have an in-person evaluation today at a local Urgent Care facility, please use the link below. It will take you to a list of all of our available [facility], including address, phone number and hours of operation. Please do not delay care.  [facility]  If you or a family member do not have a primary care provider, use the link below to schedule a visit and establish care. When you choose a [facility] primary care physician or advanced practice provider, you gain a long-term partner in health. Find a Primary Care Provider  Learn more about [facility]'s in-office and virtual care options: [facility] - Get Care Now

## 2023-01-31 NOTE — Telephone Encounter (Signed)
  Chief Complaint: cough, congestion , V/D Symptoms: see above. Cough productive clear. Constant cough. Vomited x 3 this am diarrhea x 3 loose . Headaches . Has not tested for covid  Frequency: yesterday  Pertinent Negatives: Patient denies chest pain no difficulty breathing , no fever  Disposition: [] ED /[] Urgent Care (no appt availability in office) / [] Appointment(In office/virtual)/ [x]  [facility]/ [] Home Care/ [] Refused Recommended Disposition /[] [facility]/ []  Follow-up with PCP Additional Notes:   No available appt. Recommended UC VV . Patient able to scheduled self while prompts from NT .      Reason for Disposition  [1] Continuous (nonstop) coughing interferes with work or school AND [2] no improvement using cough treatment per Care Advice  Answer Assessment - Initial Assessment Questions 1. ONSET: "When did the cough begin?"      Yesterday  2. SEVERITY: "How bad is the cough today?"      Constant coughing  3. SPUTUM: "Describe the color of your sputum" (none, dry cough; clear, white, yellow, green)     clear 4. HEMOPTYSIS: "Are you coughing up any blood?" If so ask: "How much?" (flecks, streaks, tablespoons, etc.)     na 5. DIFFICULTY BREATHING: "Are you having difficulty breathing?" If Yes, ask: "How bad is it?" (e.g., mild, moderate, severe)    - MILD: No SOB at rest, mild SOB with walking, speaks normally in sentences, can lie down, no retractions, pulse < 100.    - MODERATE: SOB at rest, SOB with minimal exertion and prefers to sit, cannot lie down flat, speaks in phrases, mild retractions, audible wheezing, pulse 100-120.    - SEVERE: Very SOB at rest, speaks in single words, struggling to breathe, sitting hunched forward, retractions, pulse > 120      Denies  6. FEVER: "Do you have a fever?" If Yes, ask: "What is your temperature, how was it measured, and when did it start?"     no 7. CARDIAC HISTORY: "Do you have any history of heart disease?"  (e.g., heart attack, congestive heart failure)      na 8. LUNG HISTORY: "Do you have any history of lung disease?"  (e.g., pulmonary embolus, asthma, emphysema)     na 9. PE RISK FACTORS: "Do you have a history of blood clots?" (or: recent major surgery, recent prolonged travel, bedridden)     na 10. OTHER SYMPTOMS: "Do you have any other symptoms?" (e.g., runny nose, wheezing, chest pain)       Cough congestion, V/D today x 3 11. PREGNANCY: "Is there any chance you are pregnant?" "When was your last menstrual period?"       na 12. TRAVEL: "Have you traveled out of the country in the last month?" (e.g., travel history, exposures)       na  Protocols used: Cough - Acute Productive-A-AH

## 2023-01-31 NOTE — Progress Notes (Signed)
Virtual Visit Consent   Alexandra Crane, you are scheduled for a virtual visit with a [facility] provider today. Just as with appointments in the office, your consent must be obtained to participate. Your consent will be active for this visit and any virtual visit you may have with one of our providers in the next 365 days. If you have a MyChart account, a copy of this consent can be sent to you electronically.  As this is a virtual visit, video technology does not allow for your provider to perform a traditional examination. This may limit your provider's ability to fully assess your condition. If your provider identifies any concerns that need to be evaluated in person or the need to arrange testing (such as labs, EKG, etc.), we will make arrangements to do so. Although advances in technology are sophisticated, we cannot ensure that it will always work on either your end or our end. If the connection with a video visit is poor, the visit may have to be switched to a telephone visit. With either a video or telephone visit, we are not always able to ensure that we have a secure connection.  By engaging in this virtual visit, you consent to the provision of healthcare and authorize for your insurance to be billed (if applicable) for the services provided during this visit. Depending on your insurance coverage, you may receive a charge related to this service.  I need to obtain your verbal consent now. Are you willing to proceed with your visit today? [name] has provided verbal consent on 01/31/2023 for a virtual visit (video or telephone). Alexandra Crane, [place]  Date: 01/31/2023 2:45 PM  Virtual Visit via Video Note   I, Alexandra Crane, connected with  Elliona JAQUE DILLAHUNT  ([id number], Aug 30, 1995) on 01/31/23 at  2:30 PM EDT by a video-enabled telemedicine application and verified that I am speaking with the correct person using two identifiers.  Location: Patient: Virtual Visit Location Patient:  Home Provider: Virtual Visit Location Provider: Home Office   I discussed the limitations of evaluation and management by telemedicine and the availability of in person appointments. The patient expressed understanding and agreed to proceed.    History of Present Illness: Alexandra Crane is a 27 y.o. who identifies as a female who was assigned female at birth, and is being seen today for URI symptoms starting yesterday afternoon and continuing today. Notes chest congestion and cough that is constant and productive but of scant clear sputum. Denies fever, chills. Had a couple of episodes of nausea and vomiting this AM with some loose stools. Denies blood in vomit or stool. Denies recent travel or sick contact.  Just tested for COVID which was negative.  Denies chest pain, SOB or abdominal pain.  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   Obesity (BMI 35.0-39.9 without comorbidity) 11/22/2014    Allergies: No Known Allergies Medications:  Current Outpatient Medications:    benzonatate (TESSALON) 100 MG capsule, Take 1 capsule (100 mg total) by mouth 3 (three) times daily as needed for cough., Disp: 30 capsule, Rfl: 0   ondansetron (ZOFRAN-ODT) 4 MG disintegrating tablet,  Take 1 tablet (4 mg total) by mouth every 8 (eight) hours as needed for nausea or vomiting., Disp: 20 tablet, Rfl: 0   cetirizine (ZYRTEC) 10 MG tablet, Take 10 mg by mouth daily., Disp: , Rfl:    SYNTHROID 100 MCG tablet, TAKE 1 TABLETS ONCE DAILY MON/WED/FRI/SAT TAKE 1 AND 1/2 TABLETS ON SUN/TUES/THURS, Disp: 102 tablet, Rfl: 1  Observations/Objective: Patient is  well-developed, well-nourished in no acute distress.  Resting comfortably at home.  Head is normocephalic, atraumatic.  No labored breathing. Speech is clear and coherent with logical content.  Patient is alert and oriented at baseline.   Assessment and Plan: 1. Viral illness - benzonatate (TESSALON) 100 MG capsule; Take 1 capsule (100 mg total) by mouth 3 (three) times daily as needed for cough.  Dispense: 30 capsule; Refill: 0 - ondansetron (ZOFRAN-ODT) 4 MG disintegrating tablet; Take 1 tablet (4 mg total) by mouth every 8 (eight) hours as needed for nausea or vomiting.  Dispense: 20 tablet; Refill: 0  COVID negative. Will have her repeat testing in 24-48 hours as a precautions. Supportive measures and OTC medications reviewed. Tessalon and Zofran per orders. Work note provided.  Follow Up Instructions: I discussed the assessment and treatment plan with the patient. The patient was provided an opportunity to ask questions and all were answered. The patient agreed with the plan and demonstrated an understanding of the instructions.  A copy of instructions were sent to the patient via MyChart unless otherwise noted below.   The patient was advised to call back or seek an in-person evaluation if the symptoms worsen or if the condition fails to improve as anticipated.  Time:  I spent 10 minutes with the patient via telehealth technology discussing the above problems/concerns.    Alexandra Climes, PA-C

## 2023-03-18 NOTE — Progress Notes (Deleted)
Name: Alexandra Crane   MRN: [id number]    DOB: 1996-03-16   Date:03/18/2023       Progress Note  Subjective  Chief Complaint  Annual Exam  HPI  Patient presents for annual CPE.  Diet: *** Exercise: ***  Last Eye Exam: *** Last Dental Exam: ***  Flowsheet Row Video Visit from 12/17/2022 in [facility]  AUDIT-C Score 0      Depression: Phq 9 is  {Desc; negative/positive:13464}    01/22/2023    8:52 AM 12/17/2022   10:26 AM 07/23/2022   11:15 AM 04/02/2022    9:19 AM 02/20/2022   10:44 AM  Depression screen PHQ 2/9  Decreased Interest 0 0 0 0 0  Down, Depressed, Hopeless 0 0 0 0 0  PHQ - 2 Score 0 0 0 0 0  Altered sleeping 0   0 0  Tired, decreased energy 0   0 0  Change in appetite 0   0 0  Feeling bad or failure about yourself  0   0 0  Trouble concentrating 0   0 0  Moving slowly or fidgety/restless 0   0 0  Suicidal thoughts 0   0 0  PHQ-9 Score 0   0 0  Difficult doing work/chores    Not difficult at all Not difficult at all   Hypertension: BP Readings from Last 3 Encounters:  01/22/23 126/72  08/20/22 126/76  07/23/22 124/72   Obesity: Wt Readings from Last 3 Encounters:  01/22/23 215 lb (97.5 kg)  08/20/22 218 lb 6.4 oz (99.1 kg)  07/23/22 221 lb 8 oz (100.5 kg)   BMI Readings from Last 3 Encounters:  01/22/23 40.62 kg/m  08/20/22 41.27 kg/m  07/23/22 41.85 kg/m     Vaccines:   HPV: N/A Tdap: up to date Shingrix: N/A Pneumonia: up to date Flu: due COVID-19: up to date   Hep C Screening:  STD testing and prevention (HIV/chl/gon/syphilis):  Intimate partner violence: negative screen  Sexual History : Menstrual History/LMP/Abnormal Bleeding:  Discussed importance of follow up if any post-menopausal bleeding: not applicable  Incontinence Symptoms: negative for symptoms   Breast cancer:  - Last Mammogram: N/A - BRCA gene screening: N/A  Osteoporosis Prevention : Discussed high calcium and vitamin D supplementation,  weight bearing exercises Bone density: N/A   Cervical cancer screening: 2020, due- ordered today  Skin cancer: Discussed monitoring for atypical lesions  Colorectal cancer: N/A   Lung cancer:  Low Dose CT Chest recommended if Age 58-80 years, 20 pack-year currently smoking OR have quit w/in 15years. Patient does not qualify for screen   ECG: N/A  Advanced Care Planning: A voluntary discussion about advance care planning including the explanation and discussion of advance directives.  Discussed health care proxy and Living will, and the patient was able to identify a health care proxy as ***.  Patient does not have a living will and power of attorney of health care   Lipids: Lab Results  Component Value Date   CHOL 138 07/23/2022   CHOL 158 06/15/2021   CHOL 160 03/10/2020   Lab Results  Component Value Date   HDL 43 (L) 07/23/2022   HDL 47 (L) 06/15/2021   HDL 44 (L) 03/10/2020   Lab Results  Component Value Date   LDLCALC 77 07/23/2022   LDLCALC 86 06/15/2021   LDLCALC 91 03/10/2020   Lab Results  Component Value Date   TRIG 101 07/23/2022   TRIG 155 (  H) 06/15/2021   TRIG 145 03/10/2020   Lab Results  Component Value Date   CHOLHDL 3.2 07/23/2022   CHOLHDL 3.4 06/15/2021   CHOLHDL 3.6 03/10/2020   No results found for: "LDLDIRECT"  Glucose: Glucose  Date Value Ref Range Status  04/22/2013 107 (H) 65 - 99 mg/dL Final   Glucose, Bld  Date Value Ref Range Status  07/23/2022 120 (H) 65 - 99 mg/dL Final    Comment:    .            Fasting reference interval . For someone without known diabetes, a glucose value between 100 and 125 mg/dL is consistent with prediabetes and should be confirmed with a follow-up test. .   06/15/2021 129 (H) 65 - 99 mg/dL Final    Comment:    .            Fasting reference interval . For someone without known diabetes, a glucose value >125 mg/dL indicates that they may have diabetes and this should be confirmed with  a follow-up test. .   03/10/2020 119 (H) 65 - 99 mg/dL Final    Comment:    .            Fasting reference interval . For someone without known diabetes, a glucose value between 100 and 125 mg/dL is consistent with prediabetes and should be confirmed with a follow-up test. .     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  Family History  Problem Relation Age of Onset   Hypertension Mother        borderline   Hypothyroidism Mother    Hypertension Father        borderline   Hypothyroidism Sister    Hypertension Maternal Grandmother    Alzheimer's disease Maternal Grandmother    Alzheimer's disease Maternal Grandfather     Social History   Socioeconomic History   Marital status: Single    Spouse name: Not on file   Number of children: Not on file   Years of education: Not on file   Highest education level: Not on file  Occupational History   Not on file  Tobacco Use   Smoking status: Never   Smokeless tobacco: Never  Vaping Use   Vaping status: Never Used  Substance and Sexual Activity   Alcohol use: No    Alcohol/week: 0.0 standard drinks of alcohol   Drug use: No   Sexual activity: Never  Other Topics Concern   Not on file  Social History Narrative   Not on file   Social Determinants of Health   Financial Resource Strain: Low Risk  (03/10/2020)   Overall Financial Resource Strain (CARDIA)    Difficulty of Paying Living Expenses: Not hard at all  Food Insecurity: No Food Insecurity (03/10/2020)   Hunger Vital Sign    Worried About Running Out of Food in the  Last Year: Never true    Ran Out of Food in the Last Year: Never true  Transportation Needs: No Transportation Needs (03/10/2020)   PRAPARE - Administrator, Civil Service (Medical): No  Lack of Transportation (Non-Medical): No  Physical Activity: Inactive (03/10/2020)   Exercise Vital Sign    Days of Exercise per Week: 0 days    Minutes of Exercise per Session: 0 min  Stress: No Stress Concern Present (03/10/2020)   [employer] of Occupational Health - Occupational Stress Questionnaire    Feeling of Stress : Not at all  Social Connections: Moderately Isolated (03/10/2020)   Social Connection and Isolation Panel [NHANES]    Frequency of Communication with Friends and Family: More than three times a week    Frequency of Social Gatherings with Friends and Family: More than three times a week    Attends Religious Services: More than 4 times per year    Active Member of [employer] or Organizations: No    Attends Banker Meetings: Never    Marital Status: Never married  Intimate Partner Violence: Not At Risk (03/10/2020)   Humiliation, Afraid, Rape, and Kick questionnaire    Fear of Current or Ex-Partner: No    Emotionally Abused: No    Physically Abused: No    Sexually Abused: No     Current Outpatient Medications:    benzonatate (TESSALON) 100 MG capsule, Take 1 capsule (100 mg total) by mouth 3 (three) times daily as needed for cough., Disp: 30 capsule, Rfl: 0   cetirizine (ZYRTEC) 10 MG tablet, Take 10 mg by mouth daily., Disp: , Rfl:    ondansetron (ZOFRAN-ODT) 4 MG disintegrating tablet, Take 1 tablet (4 mg total) by mouth every 8 (eight) hours as needed for nausea or vomiting., Disp: 20 tablet, Rfl: 0   SYNTHROID 100 MCG tablet, TAKE 1 TABLETS ONCE DAILY MON/WED/FRI/SAT TAKE 1 AND 1/2 TABLETS ON SUN/TUES/THURS, Disp: 102 tablet, Rfl: 1  No Known Allergies   ROS  ***  Objective  There were no vitals filed for this visit.  There is no height  or weight on file to calculate BMI.  Physical Exam ***  Recent Results (from the past 2160 hour(s))  Cytology (oral, anal, urethral) ancillary only     Status: None   Collection Time: 01/22/23  9:43 AM  Result Value Ref Range   Neisseria Gonorrhea Negative    Chlamydia Negative    Comment Normal Reference Ranger Chlamydia - Negative    Comment      Normal Reference Range Neisseria Gonorrhea - Negative  POCT urine pregnancy     Status: None   Collection Time: 01/22/23  9:50 AM  Result Value Ref Range   Preg Test, Ur Negative Negative     Fall Risk:    01/22/2023    8:52 AM 12/17/2022   10:25 AM 08/20/2022   11:44 AM 07/23/2022   11:15 AM 04/02/2022    9:19 AM  Fall Risk   Falls in the past year? 0 0 0 0 0  Number falls in past yr: 0 0  0 0  Injury with Fall? 0 0  0 0  Risk for fall due to : No Fall Risks  No Fall Risks  No Fall Risks  Follow up Falls prevention discussed  Falls prevention discussed;Education provided;Falls evaluation completed  Falls prevention discussed;Education provided;Falls evaluation completed     Functional Status Survey:     Assessment & Plan  1. Well adult exam ***   -USPSTF grade A and B recommendations reviewed with patient; age-appropriate recommendations, preventive care, screening tests, etc discussed and encouraged; healthy living encouraged; see AVS for patient education given to patient -Discussed importance of  150 minutes of physical activity weekly, eat two servings of fish weekly, eat one serving of tree nuts ( cashews, pistachios, pecans, almonds.Marland Kitchen) every other day, eat 6 servings of fruit/vegetables daily and drink plenty of water and avoid sweet beverages.   -Reviewed Health Maintenance: Yes.

## 2023-03-19 ENCOUNTER — Encounter: Payer: BC Managed Care – PPO | Admitting: Family Medicine

## 2023-03-19 ENCOUNTER — Encounter: Payer: Self-pay | Admitting: Family Medicine

## 2023-03-19 DIAGNOSIS — Z124 Encounter for screening for malignant neoplasm of cervix: Secondary | ICD-10-CM

## 2023-03-19 DIAGNOSIS — Z0000 Encounter for general adult medical examination without abnormal findings: Secondary | ICD-10-CM

## 2023-03-19 DIAGNOSIS — Z23 Encounter for immunization: Secondary | ICD-10-CM

## 2023-07-24 ENCOUNTER — Encounter: Payer: Self-pay | Admitting: Family Medicine

## 2023-07-24 ENCOUNTER — Ambulatory Visit: Payer: BC Managed Care – PPO | Admitting: Family Medicine

## 2023-07-24 VITALS — BP 136/80 | HR 95 | Temp 99.0°F | Resp 16 | Ht 61.0 in | Wt 210.3 lb

## 2023-07-24 DIAGNOSIS — E038 Other specified hypothyroidism: Secondary | ICD-10-CM

## 2023-07-24 DIAGNOSIS — Z130 Encounter for screening for diseases of the blood and blood-forming organs and certain disorders involving the immune mechanism: Secondary | ICD-10-CM

## 2023-07-24 DIAGNOSIS — E669 Obesity, unspecified: Secondary | ICD-10-CM

## 2023-07-24 DIAGNOSIS — F419 Anxiety disorder, unspecified: Secondary | ICD-10-CM

## 2023-07-24 DIAGNOSIS — E785 Hyperlipidemia, unspecified: Secondary | ICD-10-CM | POA: Diagnosis not present

## 2023-07-24 DIAGNOSIS — Z6839 Body mass index (BMI) 39.0-39.9, adult: Secondary | ICD-10-CM

## 2023-07-24 DIAGNOSIS — R7303 Prediabetes: Secondary | ICD-10-CM | POA: Diagnosis not present

## 2023-07-24 MED ORDER — HYDROXYZINE HCL 10 MG PO TABS: 10.0000 mg | ORAL_TABLET | Freq: Three times a day (TID) | ORAL | 0 refills | Status: DC | PRN

## 2023-07-24 NOTE — Progress Notes (Signed)
 Name: Kashawna VIVIA ROSENBURG   MRN: [id number]    DOB: 28-Aug-1995   Date:07/24/2023       Progress Note  Subjective  Chief Complaint  Chief Complaint  Patient presents with   Medical Management of Chronic Issues   HPI   Hypothyroidism: diagnosed in her pre-teen years, she has been taking  levothyroxine  100 mcg daily and an extra half pill  three days a week,   and TSH has been within normal limits.  Today she denies constipation or hair loss, not feeling as tired.    Morbid obesity: BMI is below 40 , she is now losing weight, down 11  lbs in the past year . Drinking more water stopped drinking sweet tea.    Pre-diabetes: she is trying to avoid sweets, drinking more water, denies polyphagia, polydipsia or polyuria, Last A1C was up at 5.8 % , she lost 11 lbs in the past year, she is eating a much healthier diet, keto based    AR: doing well at this time    Anxiety: she states not doing well at her current job, stress with relationship with co-workers. She is looking for another job. She is waking up before alarm clock, feels nausea in the morning before work but not on weekends.     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 No pertinent surgical history.  Family History  Problem Relation Age of Onset   Hypertension Mother        borderline   Hypothyroidism Mother    Hypertension Father        borderline   Hypothyroidism Sister    Hypertension Maternal Grandmother    Alzheimer's disease Maternal Grandmother    Alzheimer's disease Maternal Grandfather     Social History   Tobacco Use   Smoking status: Never    Smokeless tobacco: Never  Substance Use Topics   Alcohol use: No    Alcohol/week: 0.0 standard drinks of alcohol     Current Outpatient Medications:    cetirizine (ZYRTEC) 10 MG tablet, Take 10 mg by mouth daily., Disp: , Rfl:    ondansetron (ZOFRAN-ODT) 4 MG disintegrating tablet, Take 1 tablet (4 mg total) by mouth every 8 (eight) hours as needed for nausea or vomiting., Disp: 20 tablet, Rfl: 0   SYNTHROID 100 MCG tablet, TAKE 1 TABLETS ONCE DAILY MON/WED/FRI/SAT TAKE 1 AND 1/2 TABLETS ON SUN/TUES/THURS, Disp: 102 tablet, Rfl: 1   benzonatate (TESSALON) 100 MG capsule, Take 1 capsule (100 mg total) by mouth 3 (three) times daily as needed for cough. (Patient not taking: Reported on 07/24/2023), Disp: 30 capsule, Rfl: 0  No Known Allergies  I personally reviewed active problem list, medication list, allergies with the patient/caregiver today.   ROS  Ten systems reviewed and is negative except as mentioned in HPI    Objective  Vitals:   07/24/23 0833  BP: 136/80  Pulse: 95  Resp: 16  Temp: 99 F (37.2 C)  TempSrc: Oral  SpO2: 98%  Weight: 210 lb 4.8 oz (95.4 kg)  Height: 5\' 1"  (1.549 m)    Body mass index is 39.74 kg/m.  Physical Exam  Constitutional: Patient appears well-developed and well-nourished. Obese  No distress.  HEENT: head atraumatic, normocephalic, pupils equal and reactive to light, neck supple Cardiovascular: Normal rate, regular rhythm and normal heart sounds.  No murmur heard. No BLE edema. Pulmonary/Chest: Effort normal and breath sounds normal. No respiratory distress. Abdominal: Soft.  There is no tenderness. Psychiatric: Patient has a normal mood and affect. behavior is normal. Judgment and thought content normal.   Diabetic Foot Exam:     PHQ2/9:    07/24/2023    8:33 AM 01/22/2023    8:52 AM 12/17/2022   10:26 AM 07/23/2022   11:15 AM 04/02/2022    9:19 AM  Depression screen PHQ 2/9  Decreased Interest 0 0 0 0 0  Down, Depressed,  Hopeless 0 0 0 0 0  PHQ - 2 Score 0 0 0 0 0  Altered sleeping 0 0   0  Tired, decreased energy 0 0   0  Change in appetite 0 0   0  Feeling bad or failure about yourself  0 0   0  Trouble concentrating 0 0   0  Moving slowly or fidgety/restless 0 0   0  Suicidal thoughts 0 0   0  PHQ-9 Score 0 0   0  Difficult doing work/chores Not difficult at all    Not difficult at all    phq 9 is negative  Fall Risk:    01/22/2023    8:52 AM 12/17/2022   10:25 AM 08/20/2022   11:44 AM 07/23/2022   11:15 AM 04/02/2022    9:19 AM  Fall Risk   Falls in the past year? 0 0 0 0 0  Number falls in past yr: 0 0  0 0  Injury with Fall? 0 0  0 0  Risk for fall due to : No Fall Risks  No Fall Risks  No Fall Risks  Follow up Falls prevention discussed  Falls prevention discussed;Education provided;Falls evaluation completed  Falls prevention discussed;Education provided;Falls evaluation completed     Assessment & Plan  1. Prediabetes (Primary)  - Hemoglobin A1c  2. Hypothyroidism, juvenile  - TSH  3. Obesity (BMI 35.0-39.9 without comorbidity)  Discussed with the patient the risk posed by an increased BMI. Discussed importance of portion control, calorie counting and at least 150 minutes of physical activity weekly. Avoid sweet beverages and drink more water. Eat at least 6 servings of fruit and vegetables daily    4. Dyslipidemia  - COMPLETE METABOLIC PANEL WITH GFR - Lipid panel  5. Screening for deficiency anemia  - CBC with Differential/Platelet  6. Anxiety  - hydrOXYzine (ATARAX) 10 MG tablet; Take 1 tablet (10 mg total) by mouth 3 (three) times daily as needed.  Dispense: 90 tablet; Refill: 0

## 2023-08-03 LAB — LIPID PANEL
Cholesterol: 129 mg/dL (ref <200)
HDL: 45 mg/dL — ABNORMAL LOW (ref >=50)
LDL Cholesterol (Calc): 69 mg/dL
Non-HDL Cholesterol (Calc): 84 mg/dL (ref <130)
Total CHOL/HDL Ratio: 2.9 (calc) (ref <5.0)
Triglycerides: 66 mg/dL (ref <150)

## 2023-08-03 LAB — CBC WITH DIFFERENTIAL/PLATELET
Absolute Lymphocytes: 1572 {cells}/uL (ref 850–3900)
Absolute Monocytes: 514 {cells}/uL (ref 200–950)
Basophils Absolute: 63 {cells}/uL (ref 0–200)
Basophils Relative: 0.8 %
Eosinophils Absolute: 63 {cells}/uL (ref 15–500)
Eosinophils Relative: 0.8 %
HCT: 40.1 % (ref 35.0–45.0)
Hemoglobin: 13.4 g/dL (ref 11.7–15.5)
MCH: 26.9 pg — ABNORMAL LOW (ref 27.0–33.0)
MCHC: 33.4 g/dL (ref 32.0–36.0)
MCV: 80.5 fL (ref 80.0–100.0)
MPV: 10.3 fL (ref 7.5–12.5)
Monocytes Relative: 6.5 %
Neutro Abs: 5688 {cells}/uL (ref 1500–7800)
Neutrophils Relative %: 72 %
Platelets: 401 10*3/uL — ABNORMAL HIGH (ref 140–400)
RBC: 4.98 10*6/uL (ref 3.80–5.10)
RDW: 12.3 % (ref 11.0–15.0)
Total Lymphocyte: 19.9 %
WBC: 7.9 10*3/uL (ref 3.8–10.8)

## 2023-08-03 LAB — COMPREHENSIVE METABOLIC PANEL
AG Ratio: 1.6 (calc) (ref 1.0–2.5)
ALT: 27 U/L (ref 6–29)
AST: 20 U/L (ref 10–30)
Albumin: 4.6 g/dL (ref 3.6–5.1)
Alkaline phosphatase (APISO): 70 U/L (ref 31–125)
BUN: 14 mg/dL (ref 7–25)
CO2: 25 mmol/L (ref 20–32)
Calcium: 9.4 mg/dL (ref 8.6–10.2)
Chloride: 105 mmol/L (ref 98–110)
Creat: 0.85 mg/dL (ref 0.50–0.96)
Globulin: 2.8 g/dL (ref 1.9–3.7)
Glucose, Bld: 105 mg/dL — ABNORMAL HIGH (ref 65–99)
Potassium: 3.7 mmol/L (ref 3.5–5.3)
Sodium: 139 mmol/L (ref 135–146)
Total Bilirubin: 0.3 mg/dL (ref 0.2–1.2)
Total Protein: 7.4 g/dL (ref 6.1–8.1)
eGFR: 97 mL/min/{1.73_m2} (ref >=60)

## 2023-08-03 LAB — HEMOGLOBIN A1C
Hgb A1c MFr Bld: 5.6 %{Hb} (ref <5.7)
Mean Plasma Glucose: 114 mg/dL
eAG (mmol/L): 6.3 mmol/L

## 2023-08-03 LAB — TSH: TSH: 0.91 m[IU]/L

## 2023-08-05 ENCOUNTER — Encounter: Payer: Self-pay | Admitting: Family Medicine

## 2023-08-16 ENCOUNTER — Other Ambulatory Visit: Payer: Self-pay | Admitting: Family Medicine

## 2023-08-16 DIAGNOSIS — F419 Anxiety disorder, unspecified: Secondary | ICD-10-CM

## 2023-08-18 ENCOUNTER — Encounter: Payer: Self-pay | Admitting: Family Medicine

## 2023-08-18 ENCOUNTER — Other Ambulatory Visit: Payer: Self-pay | Admitting: Family Medicine

## 2023-08-18 DIAGNOSIS — E038 Other specified hypothyroidism: Secondary | ICD-10-CM

## 2023-08-19 ENCOUNTER — Other Ambulatory Visit: Payer: Self-pay | Admitting: Family Medicine

## 2023-08-19 ENCOUNTER — Other Ambulatory Visit: Payer: Self-pay

## 2023-08-19 ENCOUNTER — Telehealth: Payer: Self-pay

## 2023-08-19 DIAGNOSIS — E038 Other specified hypothyroidism: Secondary | ICD-10-CM

## 2023-08-19 MED ORDER — SYNTHROID 100 MCG PO TABS: ORAL_TABLET | ORAL | 1 refills | Status: DC

## 2023-08-19 MED ORDER — LEVOTHYROXINE SODIUM 100 MCG PO TABS
100.0000 ug | ORAL_TABLET | Freq: Every day | ORAL | 0 refills | Status: DC
Start: 2023-08-19 — End: 2023-11-14

## 2023-08-19 NOTE — Telephone Encounter (Signed)
 Copied from CRM [id number]. Topic: Clinical - Prescription Issue >> Aug 19, 2023 12:44 PM Fuller Mandril wrote: Reason for CRM: Patient called states she also sent message to provider. Requested levothyroxine (SYNTHROID, LEVOTHROID) 100 MCG tablet  but Synthroid was sent instead and patient is unable to afford Synthroid that's why it was previously changed. Requesting correction. Thank You

## 2023-09-11 ENCOUNTER — Telehealth: Admitting: Nurse Practitioner

## 2023-09-11 ENCOUNTER — Encounter: Payer: Self-pay | Admitting: Nurse Practitioner

## 2023-09-11 DIAGNOSIS — R197 Diarrhea, unspecified: Secondary | ICD-10-CM | POA: Diagnosis not present

## 2023-09-11 DIAGNOSIS — R112 Nausea with vomiting, unspecified: Secondary | ICD-10-CM | POA: Diagnosis not present

## 2023-09-11 MED ORDER — LOPERAMIDE HCL 2 MG PO CAPS: 2.0000 mg | ORAL_CAPSULE | Freq: Four times a day (QID) | ORAL | 0 refills | Status: DC | PRN

## 2023-09-11 MED ORDER — ONDANSETRON 4 MG PO TBDP: 4.0000 mg | ORAL_TABLET | Freq: Three times a day (TID) | ORAL | 0 refills | Status: DC | PRN

## 2023-09-11 MED ORDER — PROMETHAZINE HCL 25 MG PO TABS: 25.0000 mg | ORAL_TABLET | Freq: Three times a day (TID) | ORAL | 0 refills | Status: DC | PRN

## 2023-09-11 NOTE — Progress Notes (Signed)
 Name: Alexandra Crane   MRN: [id number]    DOB: [birth year]   Date:09/11/2023       Progress Note  Subjective  Chief Complaint  Chief Complaint  Patient presents with   GI Problem    Diarrhea, vomiting and headache    I connected with  Alexandra Crane  on 09/11/23 at  2:00 PM EDT by a video enabled telemedicine application and verified that I am speaking with the correct person using two identifiers.  I discussed the limitations of evaluation and management by telemedicine and the availability of in person appointments. The patient expressed understanding and agreed to proceed with a virtual visit  Staff also discussed with the patient that there may be a patient responsible charge related to this service. Patient Location: home Provider Location: [facility] Additional Individuals present: alone  HPI   Discussed the use of AI scribe software for clinical note transcription with the patient, who gave verbal consent to proceed.  History of Present Illness A 28 year old female presents with vomiting, headache, diarrhea, and stomach pain.  Symptoms began yesterday, including vomiting, headache, diarrhea, and crampy stomach pain, similar to the feeling when getting sick. No fever is present, but there is a little cough. She has not taken any specific medications for her symptoms except for Tylenol.  She has been unable to eat much, with only soup consumed yesterday and nothing today due to nausea. She estimates having about three episodes of diarrhea today.  There is potential exposure to illness, as her sister and brother-in-law had norovirus last week. 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  No fever but a little cough is present. She confirms having stomach pain and nausea.    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 Take 1 on M/W,F and Sat, take 1.5 tablets on Sun/T/T, Disp: 102 tablet, Rfl: 0   loperamide  (IMODIUM ) 2 MG capsule, Take 1 capsule (2 mg total) by mouth 4 (four) times daily as needed for diarrhea or loose stools., Disp: 12 capsule, Rfl: 0   ondansetron  (ZOFRAN -ODT) 4 MG disintegrating tablet, Take 1 tablet (4 mg total) by mouth every 8 (eight) hours as needed for nausea or vomiting., Disp: 20 tablet, Rfl: 0   promethazine  (PHENERGAN ) 25 MG tablet, Take 1 tablet (25 mg total) by mouth every 8 (eight) hours as needed for nausea or vomiting., Disp: 20 tablet, Rfl: 0  No Known Allergies  I personally reviewed active problem list, medication list, allergies with the patient/caregiver today.  ROS  Ten systems reviewed and is negative except as mentioned in HPI   Objective  Virtual encounter, vitals not obtained.  There is no height or weight on file to calculate BMI.  Nursing Note and Vital Signs reviewed.  Physical Exam  Awake, alert and oriented, speaking in complete sentences   No results found for this or any previous visit (from the past 72 hours).  Assessment & Plan  Assessment and Plan Assessment & Plan Acute gastroenteritis Acute  onset of vomiting, headache, diarrhea, and abdominal cramping since yesterday. No fever reported. Possible exposure to norovirus from sister and her husband, and works at a daycare, increasing risk of viral gastroenteritis. Symptoms suggestive of viral gastroenteritis, likely norovirus, given recent exposure and symptomatology. Dehydration is a concern due to vomiting and diarrhea, contributing to headache. - Prescribe Zofran  for mild nausea. - Prescribe Phenergan  for severe nausea and vomiting, with caution regarding drowsiness. - Prescribe Imodium  for diarrhea. - Advise increased fluid intake to prevent dehydration. - Recommend bland diet, advancing as tolerated. - Instruct to report if symptoms do not improve by the end of the week.      -Red flags and when to present for emergency care or RTC including fever >101.59F, chest pain, shortness of breath, new/worsening/un-resolving symptoms,  reviewed with patient at time of visit. Follow up and care instructions discussed and provided in AVS. - I discussed the assessment and treatment plan with the patient. The patient was provided an opportunity to ask questions and all were answered. The patient agreed with the plan and demonstrated an understanding of the instructions.  I provided 15 minutes of non-face-to-face time during this encounter.  Quinton Buckler, FNP

## 2023-09-12 ENCOUNTER — Encounter: Payer: Self-pay | Admitting: Nurse Practitioner

## 2023-10-18 ENCOUNTER — Encounter: Payer: Self-pay | Admitting: Family Medicine

## 2023-10-18 ENCOUNTER — Telehealth: Admitting: Family Medicine

## 2023-10-18 DIAGNOSIS — J069 Acute upper respiratory infection, unspecified: Secondary | ICD-10-CM

## 2023-10-18 DIAGNOSIS — R058 Other specified cough: Secondary | ICD-10-CM

## 2023-10-18 MED ORDER — AZITHROMYCIN 250 MG PO TABS: ORAL_TABLET | ORAL | 0 refills | Status: AC

## 2023-10-18 MED ORDER — HYDROCOD POLI-CHLORPHE POLI ER 10-8 MG/5ML PO SUER: 5.0000 mL | Freq: Two times a day (BID) | ORAL | 0 refills | Status: DC | PRN

## 2023-10-18 NOTE — Progress Notes (Signed)
 Name: Alexandra Crane   MRN: [id number]    DOB: Jun 15, 1995   Date:10/18/2023       Progress Note  Subjective  Chief Complaint  Chief Complaint  Patient presents with   Cough    Doctor on demand for an appointment they gave her tamiflu  but sx are not improving   Headache   Sore Throat    I connected with  Alexandra Crane  on 10/18/23 at  3:00 PM EDT by a video enabled telemedicine application and verified that I am speaking with the correct person using two identifiers.  I discussed the limitations of evaluation and management by telemedicine and the availability of in person appointments. The patient expressed understanding and agreed to proceed with a virtual visit  Staff also discussed with the patient that there may be a patient responsible charge related to this service. Patient Location: at home  Provider Location: [facility] Additional Individuals present: alone   Discussed the use of AI scribe software for clinical note transcription with the patient, who gave verbal consent to proceed.  History of Present Illness Alexandra Crane is a 28 year old female who presents with persistent cough and upper respiratory symptoms.  Symptoms began last Friday with a sore throat, cough, runny nose, and congestion. Initially, there was some improvement, but the condition has not resolved. 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  No fever or chills, but she has experienced vomiting attributed to coughing. The vomitus is described as clear mucus with a small amount of blood. She reports facial pressure and congestion in both her chest and head. No history of smoking or asthma.  She has been taking Tamiflu , Ondansetron , and Tessalon  Perles since a virtual consultation last Friday. Despite this, her symptoms, particularly the cough, have worsened, especially at night. She experiences a productive cough with clear mucus and a small amount of blood.  She has continued to work  throughout the week except for today, when she stayed home. She is currently at home alone.    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  ROS  Ten systems reviewed and is negative except as mentioned in HPI    Objective  Virtual encounter, vitals not obtained.  There is no height or weight on file to calculate BMI.  Nursing Note and Vital Signs reviewed.  Physical Exam  Awake, alert and oriented   Assessment & Plan Upper Respiratory Infection Symptoms suggest viral etiology despite worsening cough. Previous treatments ineffective. Considering azithromycin for potential bacterial infection. - Prescribe Testanex syrup for nighttime cough suppression. - Prescribe azithromycin (Z-Pak). - Advise hydration with water, Gatorade Zero, or popsicles. - Recommend rest and sleep. - Advise Tessalon  Perles during the day if working. - Instruct to avoid Testanex during the day if working. - Provide work excuse  for today and Monday, with option to return if better. - Instruct to return for in-person visit if no improvement by Monday or symptoms worsen over weekend.  General Health Maintenance Zinc and multivitamin recommended for immune support during illness. - Recommend zinc for immune support. - Suggest multivitamin,  preferably gummy form if tolerated.    -Red flags and when to present for emergency care or RTC including fever >101.46F, chest pain, shortness of breath, new/worsening/un-resolving symptoms,  reviewed with patient at time of visit. Follow up and care instructions discussed and provided in AVS. - I discussed the assessment and treatment plan with the patient. The patient was provided an opportunity to ask questions and all were answered. The patient agreed with the plan and demonstrated an understanding of the instructions.  I provided 25  minutes of non-face-to-face time during this encounter.  Arron Mcnaught F Damon Baisch, MD

## 2023-11-14 ENCOUNTER — Other Ambulatory Visit: Payer: Self-pay

## 2023-11-14 ENCOUNTER — Other Ambulatory Visit: Payer: Self-pay | Admitting: Family Medicine

## 2023-11-14 DIAGNOSIS — E038 Other specified hypothyroidism: Secondary | ICD-10-CM

## 2023-11-14 MED ORDER — LEVOTHYROXINE SODIUM 100 MCG PO TABS: 100.0000 ug | ORAL_TABLET | Freq: Every day | ORAL | 0 refills | Status: DC

## 2023-11-18 NOTE — Telephone Encounter (Signed)
 Duplicate request, refilled 11/14/23.  Requested Prescriptions  Pending Prescriptions Disp Refills   levothyroxine  (SYNTHROID ) 100 MCG tablet [Pharmacy Med Name: LEVOTHYROXINE  100 MCG TABLET] 102 tablet 0    Sig: TAKE 1 TABLET (100 MCG TOTAL) BY MOUTH DAILY. TAKE 1 ON M/W,F AND SAT, TAKE 1.5 TABLETS ON SUN/T/T     Endocrinology:  Hypothyroid Agents Passed - 11/18/2023 12:44 PM      Passed - TSH in normal range and within 360 days    TSH  Date Value Ref Range Status  08/02/2023 0.91 mIU/L Final    Comment:              Reference Range .           > or = 20 Years  0.40-4.50 .                Pregnancy Ranges           First trimester    0.26-2.66           Second trimester   0.55-2.73           Third trimester    0.43-2.91          Passed - Valid encounter within last 12 months    Recent Outpatient Visits           1 month ago Productive cough    [facility] Colome, Dorette, MD   2 months ago Nausea and vomiting, unspecified vomiting type   [facility] Gareth Mliss FALCON, FNP   3 months ago Prediabetes   [facility] Glenard Dorette, MD       Future Appointments             In 2 months Sowles, Krichna, MD [facility], [facility]

## 2024-01-15 ENCOUNTER — Ambulatory Visit: Admitting: Internal Medicine

## 2024-01-15 ENCOUNTER — Encounter: Payer: Self-pay | Admitting: Internal Medicine

## 2024-01-15 ENCOUNTER — Other Ambulatory Visit: Payer: Self-pay

## 2024-01-15 VITALS — BP 122/84 | HR 110 | Temp 99.3°F | Resp 16 | Ht 61.0 in | Wt 214.6 lb

## 2024-01-15 DIAGNOSIS — R051 Acute cough: Secondary | ICD-10-CM

## 2024-01-15 DIAGNOSIS — R110 Nausea: Secondary | ICD-10-CM | POA: Diagnosis not present

## 2024-01-15 DIAGNOSIS — J069 Acute upper respiratory infection, unspecified: Secondary | ICD-10-CM

## 2024-01-15 LAB — POC COVID19/FLU A&B COMBO
Covid Antigen, POC: NEGATIVE
Influenza A Antigen, POC: NEGATIVE
Influenza B Antigen, POC: NEGATIVE

## 2024-01-15 MED ORDER — HYDROCOD POLI-CHLORPHE POLI ER 10-8 MG/5ML PO SUER: 5.0000 mL | Freq: Two times a day (BID) | ORAL | 0 refills | Status: AC | PRN

## 2024-01-15 MED ORDER — ONDANSETRON 4 MG PO TBDP
4.0000 mg | ORAL_TABLET | Freq: Three times a day (TID) | ORAL | 0 refills | Status: AC | PRN
Start: 2024-01-15 — End: ?

## 2024-01-15 MED ORDER — FLUTICASONE PROPIONATE 50 MCG/ACT NA SUSP: 2.0000 | Freq: Every day | NASAL | 6 refills | Status: AC

## 2024-01-15 NOTE — Patient Instructions (Signed)
-  Recommend using nasal saline followed by nasal steroid (Flonase ) 2 sprays on each side twice daily until symptoms resolve. -Will prescribe cough medication and as needed nausea medication(Zofran ) -Be sure to rest and stay well hydrated.

## 2024-01-15 NOTE — Progress Notes (Signed)
 Acute Office Visit  Subjective:     Patient ID: Alexandra Crane, female    DOB: [birth year], 28 y.o.   MRN: [id number]  Chief Complaint  Patient presents with   Cough    Runny nose, sneezing, headache, nausea for 1 day    HPI Patient is in today for cough, runny nose, headache for 1 day. She is a patient of Dr. Glenard and this is my first time meeting her.   Discussed the use of AI scribe software for clinical note transcription with the patient, who gave verbal consent to proceed.  History of Present Illness Alexandra Crane is a 28 year old female who presents with upper respiratory symptoms including runny nose, cough, and sneezing.  She has experienced these symptoms for one day, including a runny nose, cough with clear sputum, sneezing, and nocturnal breathing difficulty. She denies fever but felt slightly warm the previous day. Cetirizine taken on Monday and Tuesday nights was ineffective. 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 She feels cold and tired.  She reports nausea without vomiting or abdominal pain and had diarrhea this morning, which is typical for her. No urinary symptoms are present. Her current medications include cetirizine, and she has previously used Tessalon  Perles for cough without relief.   Review of Systems  Constitutional:  Positive for chills and malaise/fatigue. Negative for fever.  HENT:  Positive for congestion. Negative for ear pain, sinus pain and sore throat.   Respiratory:  Positive for cough. Negative for sputum production.   Gastrointestinal:  Positive for nausea. Negative for abdominal pain, constipation, diarrhea and vomiting.  Genitourinary:  Negative for dysuria, frequency and urgency.  Neurological:  Positive for headaches.        Objective:    BP 122/84 (Cuff Size: Large)   Pulse (!) 110   Temp 99.3 F (37.4 C) (Oral)   Resp 16   Ht 5' 1 (1.549 m)   Wt 214 lb 9.6 oz (97.3 kg)   SpO2  99%   BMI 40.55 kg/m    Physical Exam Constitutional:      Appearance: Normal appearance.  HENT:     Head: Normocephalic and atraumatic.     Right Ear: Tympanic membrane, ear canal and external ear normal.     Left Ear: Tympanic membrane, ear canal and external ear normal.     Mouth/Throat:     Mouth: Mucous membranes are moist.     Pharynx: Posterior oropharyngeal erythema present.  Eyes:     Conjunctiva/sclera: Conjunctivae normal.  Cardiovascular:     Rate and Rhythm: Normal rate and regular rhythm.  Pulmonary:     Effort: Pulmonary effort is normal.     Breath sounds: Normal breath sounds. No wheezing, rhonchi or rales.  Musculoskeletal:     Cervical back: No tenderness.  Lymphadenopathy:     Cervical: No cervical adenopathy.  Skin:    General: Skin is warm and dry.  Neurological:     General: No focal deficit present.     Mental Status: She is alert. Mental status is at baseline.  Psychiatric:        Mood and Affect: Mood normal.        Behavior: Behavior normal.     Results for orders placed or performed in visit on 01/15/24  POC Covid19/Flu A&B Antigen  Result Value Ref Range   Influenza A Antigen, POC Negative Negative   Influenza  B Antigen, POC Negative Negative   Covid Antigen, POC Negative Negative        Assessment & Plan:   Assessment & Plan Acute upper respiratory infection with cough, nausea, and diarrhea Acute viral upper respiratory infection with cough, nausea, and diarrhea. No fever or abdominal pain. Viral etiology suspected; antibiotics not indicated. Supportive care recommended. Caution with Imodium  due to constipation risk. Sudafed in Zyrtec-D may elevate blood pressure, but no hypertension present. - COVID and flu test negative in the office today.  - Advise rest and hydration. - Recommend zinc and vitamin C. - Prescribe Tessanex cough syrup for five days. - Prescribe Zofran  for nausea. - Advise nasal saline and Flonase  nasal spray. -  Recommend Zyrtec-D for congestion, cautioning about blood pressure. - Provide work note for Wednesday and Thursday, return on Friday.  - POC Covid19/Flu A&B Antigen - chlorpheniramine-HYDROcodone (TUSSIONEX) 10-8 MG/5ML; Take 5 mLs by mouth every 12 (twelve) hours as needed for up to 5 days.  Dispense: 50 mL; Refill: 0 - fluticasone  (FLONASE ) 50 MCG/ACT nasal spray; Place 2 sprays into both nostrils daily.  Dispense: 16 g; Refill: 6 - ondansetron  (ZOFRAN -ODT) 4 MG disintegrating tablet; Take 1 tablet (4 mg total) by mouth every 8 (eight) hours as needed for nausea or vomiting.  Dispense: 20 tablet; Refill: 0   Return if symptoms worsen or fail to improve.  Sharyle Fischer, DO

## 2024-01-17 ENCOUNTER — Encounter: Payer: Self-pay | Admitting: Family Medicine

## 2024-01-17 ENCOUNTER — Telehealth: Payer: Self-pay

## 2024-01-17 NOTE — Telephone Encounter (Signed)
 Letter sent for today to patient

## 2024-01-17 NOTE — Telephone Encounter (Signed)
 Patient notified of labs.

## 2024-01-17 NOTE — Telephone Encounter (Signed)
 Copied from CRM [id number]. Topic: Clinical - Prescription Issue >> Jan 17, 2024 10:27 AM Ivette P wrote: Reason for CRM: Medication that pt was given is stating that it is not working and would like to know if a new medication can be sent or a new antibiotic.

## 2024-01-22 ENCOUNTER — Ambulatory Visit: Payer: Self-pay | Admitting: Family Medicine

## 2024-01-24 ENCOUNTER — Ambulatory Visit: Payer: Self-pay | Admitting: Family Medicine

## 2024-02-08 ENCOUNTER — Other Ambulatory Visit: Payer: Self-pay | Admitting: Family Medicine

## 2024-02-08 DIAGNOSIS — E038 Other specified hypothyroidism: Secondary | ICD-10-CM

## 2024-02-11 ENCOUNTER — Telehealth: Admitting: Family Medicine

## 2024-02-11 ENCOUNTER — Encounter: Payer: Self-pay | Admitting: Family Medicine

## 2024-02-11 DIAGNOSIS — R21 Rash and other nonspecific skin eruption: Secondary | ICD-10-CM | POA: Diagnosis not present

## 2024-02-11 MED ORDER — TRIAMCINOLONE ACETONIDE 0.1 % EX CREA: 1.0000 | TOPICAL_CREAM | Freq: Two times a day (BID) | CUTANEOUS | 0 refills | Status: AC | PRN

## 2024-02-11 MED ORDER — HYDROXYZINE HCL 10 MG PO TABS: 10.0000 mg | ORAL_TABLET | Freq: Three times a day (TID) | ORAL | 0 refills | Status: DC | PRN

## 2024-02-11 MED ORDER — CETIRIZINE HCL 10 MG PO TABS
10.0000 mg | ORAL_TABLET | Freq: Every day | ORAL | 0 refills | Status: AC
Start: 2024-02-11 — End: ?

## 2024-02-11 NOTE — Progress Notes (Signed)
 Name: Alexandra Crane   MRN: [id number]    DOB: 12/16/1995   Date:02/11/2024       Progress Note  Subjective  Chief Complaint  Chief Complaint  Patient presents with   Rash    red itching tiny bumps on left forearm and stomach, has had for the past few weeks.     I connected with  Alexandra Crane  on 02/11/24 at  1:00 PM EDT by a video enabled telemedicine application and verified that I am speaking with the correct person using two identifiers.  I discussed the limitations of evaluation and management by telemedicine and the availability of in person appointments. The patient expressed understanding and agreed to proceed with a virtual visit  Staff also discussed with the patient that there may be a patient responsible charge related to this service. Patient Location: at work  Provider Location: [facility] Additional Individuals present: kids around  Discussed the use of AI scribe software for clinical note transcription with the patient, who gave verbal consent to proceed.  History of Present Illness Alexandra Crane is a 28 year old female with eczema who presents with red, itchy bumps on her arms, stomach, and legs.  She has been experiencing red, itchy bumps on her arms, stomach, and legs for approximately two weeks. The bumps are dry and have not improved with her current cream. The rash appears randomly and has been worsening, with periods of healing followed by exacerbation. No oozing from the bumps, but scratching has occasionally led to bleeding.  She has a history of eczema and has been using hydroxyzine  for itching, but she does not currently have any creams. She has been prescribed cetirizine (Zyrtec) to take daily. She is unsure of the exact name of the cream she has been using, but believes it may be hydrocortisone , which she feels is not effective.  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 She has not traveled recently. She recalls  having an upper respiratory infection two to three weeks ago, for which she took medication, but she cannot remember the exact timing of when she completed the course.  No fever, chills, or recent cold symptoms prior to the onset of the rash.    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   Obesity (BMI 35.0-39.9 without comorbidity) 11/22/2014    Social History   Tobacco Use   Smoking status: Never   Smokeless tobacco: Never  Substance Use Topics   Alcohol use: No    Alcohol/week: 0.0 standard drinks of alcohol     Current Outpatient Medications:    cetirizine (ZYRTEC) 10 MG tablet, Take 10 mg by mouth daily., Disp: , Rfl:    fluticasone  (FLONASE ) 50 MCG/ACT nasal spray, Place 2 sprays into both nostrils daily., Disp: 16 g, Rfl: 6   hydrOXYzine  (ATARAX ) 10 MG tablet, Take 1 tablet (10 mg total) by mouth 3 (three) times daily as needed., Disp: 90 tablet, Rfl: 0   levothyroxine  (SYNTHROID ) 100 MCG tablet, TAKE 1 TABLET (100 MCG TOTAL) BY MOUTH DAILY. TAKE 1 ON M/W,F AND SAT, TAKE 1.5 TABLETS ON SUN/T/T, Disp: 102 tablet, Rfl: 0   ondansetron  (  ZOFRAN -ODT) 4 MG disintegrating tablet, Take 1 tablet (4 mg total) by mouth every 8 (eight) hours as needed for nausea or vomiting., Disp: 20 tablet, Rfl: 0  No Known Allergies  I personally reviewed active problem list, medication list, allergies, family history with the patient/caregiver today.  ROS  Ten systems reviewed and is negative except as mentioned in HPI    Objective  Virtual encounter, vitals not obtained.  There is no height or weight on file to calculate BMI.  Nursing Note and Vital Signs reviewed.  Physical Exam  Awake  , alert and oriented    Assessment & Plan Pruritic rash of arms, stomach, and legs Rash is dry, itchy, worsening, and not typical of her usual eczema. Differential includes eczema, scabies, bed bugs, or other reactions. No systemic symptoms or contagion signs. - Prescribed triamcinolone  cream. - Instructed to take hydroxyzine  for itching. - Advised daily cetirizine. - Requested rash photos via MyChart for evaluation. - Educated on triamcinolone  application, avoiding sensitive areas. - Advised against scratching.  Eczema Current rash does not resemble typical eczema. - Use triamcinolone  if rash is eczema.      -Red flags and when to present for emergency care or RTC including fever >101.41F, chest pain, shortness of breath, new/worsening/un-resolving symptoms,  reviewed with patient at time of visit. Follow up and care instructions discussed and provided in AVS. - I discussed the assessment and treatment plan with the patient. The patient was provided an opportunity to ask questions and all were answered. The patient agreed with the plan and demonstrated an understanding of the instructions.  I provided 15  minutes of non-face-to-face time during this encounter.  Alexandra Holstad F Shaneen Reeser, MD

## 2024-03-04 ENCOUNTER — Other Ambulatory Visit: Payer: Self-pay | Admitting: Family Medicine

## 2024-03-04 DIAGNOSIS — R21 Rash and other nonspecific skin eruption: Secondary | ICD-10-CM

## 2024-03-19 ENCOUNTER — Encounter: Payer: Self-pay | Admitting: Family Medicine

## 2024-03-19 ENCOUNTER — Ambulatory Visit: Payer: Self-pay | Admitting: Family Medicine

## 2024-03-19 DIAGNOSIS — J302 Other seasonal allergic rhinitis: Secondary | ICD-10-CM

## 2024-03-19 DIAGNOSIS — L2082 Flexural eczema: Secondary | ICD-10-CM

## 2024-03-19 DIAGNOSIS — R21 Rash and other nonspecific skin eruption: Secondary | ICD-10-CM

## 2024-03-19 DIAGNOSIS — E038 Other specified hypothyroidism: Secondary | ICD-10-CM

## 2024-03-19 DIAGNOSIS — R7303 Prediabetes: Secondary | ICD-10-CM

## 2024-03-19 DIAGNOSIS — E786 Lipoprotein deficiency: Secondary | ICD-10-CM | POA: Insufficient documentation

## 2024-03-19 MED ORDER — PIMECROLIMUS 1 % EX CREA: TOPICAL_CREAM | Freq: Two times a day (BID) | CUTANEOUS | 2 refills | Status: AC

## 2024-03-19 MED ORDER — LEVOTHYROXINE SODIUM 100 MCG PO TABS
100.0000 ug | ORAL_TABLET | Freq: Every day | ORAL | 1 refills | Status: AC
Start: 2024-03-19 — End: ?

## 2024-03-19 NOTE — Progress Notes (Signed)
 Name: Alexandra Crane   MRN: [id number]    DOB: 1996/04/26   Date:03/19/2024       Progress Note  Subjective  Chief Complaint  Chief Complaint  Patient presents with   Medical Management of Chronic Issues   Discussed the use of AI scribe software for clinical note transcription with the patient, who gave verbal consent to proceed.  History of Present Illness Alexandra Crane is a 28 year old female who presents with a persistent rash.  She has a persistent rash primarily on the left side of her body, including her arm,  and trunk and small spot on left thigh . The rash is itchy, dry, and bumpy, and has been present for over six weeks. It began before a recent vacation and has not improved with the use of triamcinolone  cream. She has been taking Zyrtec orally, but it has not alleviated the symptoms. 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  She has hypothyroidism, diagnosed in her preteen years, and is currently taking levothyroxine . Her regimen includes 100 mcg daily, with an additional half pill three days a week. No problems with swallowing or changes in bowel movements. Her last TSH check in March was within normal limits.  She is managing her weight through a keto diet and has successfully maintained a weight loss of nine pounds since last year. She drinks lots of water and avoids high-carb foods like potatoes, pasta, rice, and sweets.  Her blood work from March showed slightly elevated blood sugar levels, but her A1c was  back to normal . She has a history of prediabetes and is working on improving her diet to manage it.  No fever, chills, tiredness, or changes in bowel movements.    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 No pertinent surgical history.  Family History  Problem Relation Age of Onset   Hypertension Mother        borderline   Hypothyroidism Mother    Hypertension Father        borderline   Hypothyroidism Sister    Hypertension Maternal Grandmother    Alzheimer's disease Maternal Grandmother    Alzheimer's disease Maternal Grandfather     Social History   Tobacco Use   Smoking status: Never   Smokeless tobacco: Never  Substance Use Topics   Alcohol use: No    Alcohol/week: 0.0 standard drinks of alcohol     Current Outpatient Medications:    cetirizine (ZYRTEC) 10 MG tablet, Take 1 tablet (10 mg total) by mouth daily., Disp: 90 tablet, Rfl: 0   fluticasone  (FLONASE ) 50 MCG/ACT nasal spray, Place 2 sprays into both nostrils daily., Disp: 16 g, Rfl: 6   hydrOXYzine  (ATARAX ) 10 MG tablet, TAKE 1 TABLET BY MOUTH THREE TIMES A DAY AS NEEDED, Disp: 270 tablet, Rfl: 0   ondansetron  (ZOFRAN -ODT) 4 MG disintegrating tablet, Take 1 tablet (4 mg total) by mouth every 8 (  eight) hours as needed for nausea or vomiting., Disp: 20 tablet, Rfl: 0   pimecrolimus (ELIDEL) 1 % cream, Apply topically 2 (two) times daily., Disp: 100 g, Rfl: 2   triamcinolone  cream (KENALOG ) 0.1 %, Apply 1 Application topically 2 (two) times daily as needed., Disp: 453.6 g, Rfl: 0   levothyroxine  (SYNTHROID ) 100 MCG tablet, Take 1 tablet (100 mcg total) by mouth daily. Take 1 on M/W,F and Sat, take 1.5 tablets on Sun/T/T, Disp: 102 tablet, Rfl: 1  No Known Allergies  I personally reviewed active problem list, medication list, allergies, family history with the patient/caregiver today.   ROS  Ten systems reviewed  and is negative except as mentioned in HPI    Objective Physical Exam CONSTITUTIONAL: Patient appears well-developed and well-nourished. No distress. HEENT: Head atraumatic, normocephalic, neck supple. CARDIOVASCULAR: Normal rate, regular rhythm and normal heart sounds. No murmur heard. No BLE edema. PULMONARY: Effort normal and breath sounds normal. No respiratory distress. ABDOMINAL: There is no tenderness or distention. MUSCULOSKELETAL: Normal gait. Without gross motor or sensory deficit. PSYCHIATRIC: Patient has a normal mood and affect. Behavior is normal. Judgment and thought content normal. SKIN: Skin dry with red itchy papules on the left arm and abdomen.  Vitals:   03/19/24 1054  BP: 114/74  Pulse: 97  Resp: 16  SpO2: 99%  Weight: 211 lb 6.4 oz (95.9 kg)  Height: 5' 1 (1.549 m)    Body mass index is 39.94 kg/m.  Recent Results (from the past 2160 hours)  POC Covid19/Flu A&B Antigen     Status: None   Collection Time: 01/15/24 10:41 AM  Result Value Ref Range   Influenza A Antigen, POC Negative Negative   Influenza B Antigen, POC Negative Negative   Covid Antigen, POC Negative Negative    Diabetic Foot Exam:     PHQ2/9:    03/19/2024   10:50 AM 02/11/2024   12:52 PM 10/18/2023    2:27 PM 07/24/2023    8:33 AM 01/22/2023    8:52 AM  Depression screen PHQ 2/9  Decreased Interest 0 0 0 0 0  Down, Depressed, Hopeless 0 0 0 0 0  PHQ - 2 Score 0 0 0 0 0  Altered sleeping   0 0 0  Tired, decreased energy   0 0 0  Change in appetite   0 0 0  Feeling bad or failure about yourself    0 0 0  Trouble concentrating   0 0 0  Moving slowly or fidgety/restless   0 0 0  Suicidal thoughts   0 0 0  PHQ-9 Score   0  0  0   Difficult doing work/chores   Not difficult at all Not difficult at all      Data saved with a previous flowsheet row definition    phq 9 is negative  Fall Risk:    03/19/2024   10:50 AM 02/11/2024   12:52 PM 10/18/2023    2:26 PM 01/22/2023    8:52  AM 12/17/2022   10:25 AM  Fall Risk   Falls in the past year? 0 0 0 0 0  Number falls in past yr: 0 0 0 0 0  Injury with Fall? 0 0 0 0 0  Risk for fall due to : No Fall Risks No Fall Risks No Fall Risks No Fall Risks   Follow up Falls evaluation completed Falls evaluation completed Falls prevention discussed;Education provided;Falls evaluation completed Falls prevention discussed  Assessment & Plan Eczema with persistent pruritic rash, left side of body Persistent pruritic rash resembling eczema, unresponsive to triamcinolone . Differential includes eczema, no infection signs, no systemic symptoms. - Referred to dermatologist for further evaluation. - Prescribed Elidel cream for itching and rash. - Advised hydroxyzine  for evening itching.  Other seasonal allergic rhinitis Managed with Zyrtec and Flonase  as needed. - Continue Zyrtec and Flonase  as needed.  Prediabetes A1c improved from 5.8 to 5.6 with weight loss and keto diet. - Continue keto diet and weight management strategies.  Other specified hypothyroidism Managed with levothyroxine , no symptoms, normal TSH. - Continue current levothyroxine  regimen.  Morbid obesity, BMI >35 with prediabetes  BMI reduced from over 40 to below 40 through dietary changes. - Continue dietary and lifestyle modifications to reduce BMI further.  Low HDL cholesterol Low HDL, normal LDL. Cardiovascular protective measures discussed. - Encouraged consumption of tree nuts and fish. - Promoted regular physical activity.  General Health Maintenance Discussed flu vaccination and Pap smear. Addressed flu shot concerns, Pap smear overdue since 2020. - Encouraged flu vaccination - she states she will return for that - Scheduled Pap smear.

## 2024-03-24 ENCOUNTER — Telehealth: Admitting: Family Medicine

## 2024-03-24 ENCOUNTER — Ambulatory Visit: Payer: Self-pay

## 2024-03-24 DIAGNOSIS — A084 Viral intestinal infection, unspecified: Secondary | ICD-10-CM

## 2024-03-24 MED ORDER — LOPERAMIDE HCL 2 MG PO CAPS: ORAL_CAPSULE | ORAL | 0 refills | Status: AC

## 2024-03-24 MED ORDER — METOCLOPRAMIDE HCL 5 MG PO TABS: 5.0000 mg | ORAL_TABLET | Freq: Three times a day (TID) | ORAL | 0 refills | Status: AC | PRN

## 2024-03-24 NOTE — Progress Notes (Signed)
 MyChart Video Visit    Virtual Visit via Video Note   This format is felt to be most appropriate for this patient at this time. Physical exam was limited by quality of the video and audio technology used for the visit.   Patient location: [place]  Provider location: [facility]  I discussed the limitations of evaluation and management by telemedicine and the availability of in person appointments. The patient expressed understanding and agreed to proceed.  Patient: Alexandra Crane   DOB: 04-05-1996   28 y.o. Female  MRN: [id number] Visit Date: 03/24/2024  Today's healthcare provider: LAURAINE LOISE BUOY, DO   No chief complaint on file.  Subjective    HPI  Alexandra Crane is a 28 year old female who presents with diarrhea, vomiting, and mild cough.  Symptoms began on Sunday night, including diarrhea, vomiting, and a mild cough. The cough is intermittent, and the diarrhea is watery, occurring four to five times in the last 24 hours, with three episodes this morning. There is no urgency to run to the bathroom, but bowel movements are frequent. Vomiting occurred three times in the past 24 hours, with the last episode at 9 AM today. No blood is present in the vomit.  She experiences mild abdominal pain, described as aching rather than sharp. She has been able to keep fluids down and has consumed soup and crackers since 9 AM. Her last menstrual period was two weeks ago. Ondansetron  is used for nausea, taken around 8 AM today, providing some relief, though nausea persists. No recent antibiotic use or other medications for symptoms.  She works at a daycare. She has been out of work since yesterday due to symptoms and is seeking a doctor's note for her absence. No sharp abdominal pain or blood in vomit. Confirms mild cough, diarrhea, and nausea.    Medications: Outpatient Medications Prior to Visit  Medication Sig   cetirizine (ZYRTEC) 10 MG tablet Take 1 tablet (10 mg total)  by mouth daily.   fluticasone  (FLONASE ) 50 MCG/ACT nasal spray Place 2 sprays into both nostrils daily.   hydrOXYzine  (ATARAX ) 10 MG tablet TAKE 1 TABLET BY MOUTH THREE TIMES A DAY AS NEEDED   levothyroxine  (SYNTHROID ) 100 MCG tablet Take 1 tablet (100 mcg total) by mouth daily. Take 1 on M/W,F and Sat, take 1.5 tablets on Sun/T/T   ondansetron  (ZOFRAN -ODT) 4 MG disintegrating tablet Take 1 tablet (4 mg total) by mouth every 8 (eight) hours as needed for nausea or vomiting.   pimecrolimus (ELIDEL) 1 % cream Apply topically 2 (two) times daily.   triamcinolone  cream (KENALOG ) 0.1 % Apply 1 Application topically 2 (two) times daily as needed.   No facility-administered medications prior to visit.    Review of Systems  Constitutional:  Positive for fatigue. Negative for chills and fever.  Respiratory:  Positive for cough (mild, infrequent). Negative for shortness of breath.   Gastrointestinal:  Positive for abdominal pain (mild ache), diarrhea, nausea and vomiting. Negative for blood in stool and constipation.        Objective    LMP 03/10/2024       Physical Exam Constitutional:      General: She is not in acute distress.    Appearance: Normal appearance.  HENT:     Head: Normocephalic.  Pulmonary:     Effort: Pulmonary effort is normal. No respiratory distress.  Neurological:     Mental Status: She is alert and oriented to person, place,  and time. Mental status is at baseline.       Assessment & Plan    Viral gastroenteritis -     Loperamide  HCl; 4 mg (2 capsules) orally followed by 2 mg (1 capsule) after each unformed stool; MAX 16 mg (8 capsules) per day  Dispense: 16 capsule; Refill: 0 -     Metoclopramide HCl; Take 1 tablet (5 mg total) by mouth every 8 (eight) hours as needed for nausea.  Dispense: 20 tablet; Refill: 0   Likely viral etiology due to daycare exposure and symptoms. Current ondansetron  dose insufficient for nausea control. - Prescribed metoclopramide  as an alternative for nausea; advised to discontinue ondansetron . - Recommended loperamide  for diarrhea. - Advised on hand hygiene and avoiding contaminated items. - Encouraged rest and hydration. - Provided a doctor's note for absence on Monday and Tuesday.    Return if symptoms worsen or fail to improve.     I discussed the assessment and treatment plan with the patient. The patient was provided an opportunity to ask questions and all were answered. The patient agreed with the plan and demonstrated an understanding of the instructions.   The patient was advised to call back or seek an in-person evaluation if the symptoms worsen or if the condition fails to improve as anticipated.  I provided 10 minutes of virtual-face-to-face time during this encounter.   LAURAINE LOISE BUOY, DO [facility] [phone] (phone) [phone] (fax)  [facility]

## 2024-03-24 NOTE — Telephone Encounter (Signed)
 FYI Only or Action Required?: FYI only for provider: appointment scheduled on 03/24/24.  Patient was last seen in primary care on 03/19/2024 by Glenard Mire, MD.  Called Nurse Triage reporting Vomiting, Diarrhea, and Cough.  Symptoms began several days ago.  Interventions attempted: Rest, hydration, or home remedies.  Symptoms are: gradually worsening.  Triage Disposition: See Physician Within 24 Hours  Patient/caregiver understands and will follow disposition?: Yes  Copied from CRM #[id number]. Topic: Clinical - Pink Crane Triage >> Mar 24, 2024  8:18 AM [name] wrote: Alexandra Crane triggered transfer to Nurse Triage. See Triage Message for details.  Uncontrolled vomiting and diarrhea since Sunday along with a persistent cough. No fever. [name] >> Mar 24, 2024 10:03 AM [name] wrote: Patient calling back because no one has reached out to her in regards to symptoms she is having, warm xferred to NT.  Reason for Disposition  [1] MODERATE diarrhea (e.g., 4-6 times / day more than normal) AND [2] present > 48 hours (2 days)  Answer Assessment - Initial Assessment Questions Pt reports onset of moderate diarrhea and vomiting since Sunday and mild cough. Denies fever. No blood in stool or emesis. Able to got enough fluids in and get around house/care for self. Virtual appt scheduled at different office today in pt region d/t no availability at home office in next 24 hrs. Advised UC or ED for worsening symptoms.   1. DIARRHEA SEVERITY: How bad is the diarrhea? How many more stools have you had in the past 24 hours than normal?      4-5 times  2. ONSET: When did the diarrhea begin?      Sunday  3. STOOL DESCRIPTION:  How loose or watery is the diarrhea? What is the stool color? Is there any blood or mucous in the stool?     Watery brown  4. VOMITING: Are you also vomiting? If Yes, ask: How many times in the past 24 hours?      3x in past 24 hours.  Denies blood.   5. ABDOMEN PAIN: Are you having any abdomen pain? If Yes, ask: What does it feel like? (e.g., crampy, dull, intermittent, constant)      General abdomen, aching  6. ABDOMEN PAIN SEVERITY: If present, ask: How bad is the pain?  (e.g., Scale 1-10; mild, moderate, or severe)     3-4/10  7. ORAL INTAKE: If vomiting, Have you been able to drink liquids? How much liquids have you had in the past 24 hours?     About 5 glasses of water and soup  8. HYDRATION: Any signs of dehydration? (e.g., dry mouth [not just dry lips], too weak to stand, dizziness, new weight loss) When did you last urinate?     Dry mouth, denies dizziness, tired. Able to stand and walk.  9. EXPOSURE: Have you traveled to a foreign country recently? Have you been exposed to anyone with diarrhea? Could you have eaten any food that was spoiled?     Travelled 1 month ago to [place] on a cruise. No concerns for food poisoning.  10. ANTIBIOTIC USE: Are you taking antibiotics now or have you taken antibiotics in the past 2 months?       Denies  11. OTHER SYMPTOMS: Do you have any other symptoms? (e.g., fever, blood in stool)       Denies fever. Reports mild cough without blood.  12. PREGNANCY: Is there any chance you are pregnant? When was your last menstrual  period?       Denies, LMP 2 weeks ago.  Protocols used: [facility]

## 2024-04-08 ENCOUNTER — Ambulatory Visit

## 2024-04-23 ENCOUNTER — Ambulatory Visit

## 2024-05-04 ENCOUNTER — Ambulatory Visit: Admitting: Dermatology

## 2024-09-16 ENCOUNTER — Encounter: Admitting: Family Medicine

## 2025-03-19 ENCOUNTER — Ambulatory Visit: Admitting: Family Medicine
